# Patient Record
Sex: Male | Born: 1950
Health system: Southern US, Community
[De-identification: ages and names within clinical notes are randomized; demographics above are authoritative.]

## PROBLEM LIST (undated history)

## (undated) DIAGNOSIS — R569 Unspecified convulsions: Secondary | ICD-10-CM

## (undated) DIAGNOSIS — F32A Depression, unspecified: Secondary | ICD-10-CM

## (undated) DIAGNOSIS — E7800 Pure hypercholesterolemia, unspecified: Secondary | ICD-10-CM

## (undated) DIAGNOSIS — I639 Cerebral infarction, unspecified: Secondary | ICD-10-CM

## (undated) DIAGNOSIS — F329 Major depressive disorder, single episode, unspecified: Secondary | ICD-10-CM

## (undated) HISTORY — PX: NECK SURGERY: SHX720

## (undated) HISTORY — PX: OTHER SURGICAL HISTORY: SHX169

## (undated) HISTORY — DX: Depression, unspecified: F32.A

## (undated) HISTORY — DX: Major depressive disorder, single episode, unspecified: F32.9

## (undated) HISTORY — DX: Unspecified convulsions: R56.9

## (undated) HISTORY — DX: Cerebral infarction, unspecified: I63.9

## (undated) HISTORY — DX: Pure hypercholesterolemia, unspecified: E78.00

## (undated) HISTORY — PX: ELBOW SURGERY: SHX618

---

## 2000-01-23 ENCOUNTER — Emergency Department (HOSPITAL_COMMUNITY): Admission: EM | Admit: 2000-01-23 | Discharge: 2000-01-23 | Payer: Self-pay | Admitting: Emergency Medicine

## 2000-01-24 ENCOUNTER — Encounter: Payer: Self-pay | Admitting: Emergency Medicine

## 2002-08-16 ENCOUNTER — Ambulatory Visit: Admission: RE | Admit: 2002-08-16 | Discharge: 2002-08-16 | Payer: Self-pay | Admitting: Internal Medicine

## 2002-08-16 ENCOUNTER — Encounter: Payer: Self-pay | Admitting: Internal Medicine

## 2002-08-16 ENCOUNTER — Encounter: Payer: Self-pay | Admitting: *Deleted

## 2002-08-16 ENCOUNTER — Inpatient Hospital Stay (HOSPITAL_COMMUNITY): Admission: EM | Admit: 2002-08-16 | Discharge: 2002-08-20 | Payer: Self-pay | Admitting: *Deleted

## 2002-08-17 ENCOUNTER — Encounter: Payer: Self-pay | Admitting: Cardiology

## 2002-08-19 ENCOUNTER — Encounter: Payer: Self-pay | Admitting: Neurology

## 2002-08-20 ENCOUNTER — Inpatient Hospital Stay (HOSPITAL_COMMUNITY)
Admission: RE | Admit: 2002-08-20 | Discharge: 2002-10-07 | Payer: Self-pay | Admitting: Physical Medicine & Rehabilitation

## 2002-08-23 ENCOUNTER — Encounter: Payer: Self-pay | Admitting: Neurology

## 2002-08-24 ENCOUNTER — Encounter: Payer: Self-pay | Admitting: Neurology

## 2002-09-28 ENCOUNTER — Encounter: Payer: Self-pay | Admitting: Physical Medicine & Rehabilitation

## 2002-11-06 ENCOUNTER — Emergency Department (HOSPITAL_COMMUNITY): Admission: EM | Admit: 2002-11-06 | Discharge: 2002-11-06 | Payer: Self-pay | Admitting: Emergency Medicine

## 2002-11-06 ENCOUNTER — Encounter: Payer: Self-pay | Admitting: Emergency Medicine

## 2002-11-09 ENCOUNTER — Encounter
Admission: RE | Admit: 2002-11-09 | Discharge: 2003-02-07 | Payer: Self-pay | Admitting: Physical Medicine & Rehabilitation

## 2002-12-08 ENCOUNTER — Encounter
Admission: RE | Admit: 2002-12-08 | Discharge: 2003-03-08 | Payer: Self-pay | Admitting: Physical Medicine & Rehabilitation

## 2003-02-17 ENCOUNTER — Encounter
Admission: RE | Admit: 2003-02-17 | Discharge: 2003-05-18 | Payer: Self-pay | Admitting: Physical Medicine & Rehabilitation

## 2003-06-01 ENCOUNTER — Encounter
Admission: RE | Admit: 2003-06-01 | Discharge: 2003-08-30 | Payer: Self-pay | Admitting: Physical Medicine & Rehabilitation

## 2003-09-23 ENCOUNTER — Encounter
Admission: RE | Admit: 2003-09-23 | Discharge: 2003-12-22 | Payer: Self-pay | Admitting: Physical Medicine & Rehabilitation

## 2003-12-15 ENCOUNTER — Ambulatory Visit (HOSPITAL_COMMUNITY): Admission: RE | Admit: 2003-12-15 | Discharge: 2003-12-15 | Payer: Self-pay | Admitting: Neurology

## 2003-12-23 ENCOUNTER — Encounter
Admission: RE | Admit: 2003-12-23 | Discharge: 2004-01-13 | Payer: Self-pay | Admitting: Physical Medicine & Rehabilitation

## 2004-04-06 ENCOUNTER — Encounter
Admission: RE | Admit: 2004-04-06 | Discharge: 2004-06-06 | Payer: Self-pay | Admitting: Physical Medicine & Rehabilitation

## 2004-04-10 ENCOUNTER — Ambulatory Visit: Payer: Self-pay | Admitting: Physical Medicine & Rehabilitation

## 2004-07-04 ENCOUNTER — Encounter
Admission: RE | Admit: 2004-07-04 | Discharge: 2004-10-02 | Payer: Self-pay | Admitting: Physical Medicine & Rehabilitation

## 2004-07-06 ENCOUNTER — Ambulatory Visit: Payer: Self-pay | Admitting: Physical Medicine & Rehabilitation

## 2004-12-19 ENCOUNTER — Encounter
Admission: RE | Admit: 2004-12-19 | Discharge: 2005-03-19 | Payer: Self-pay | Admitting: Physical Medicine & Rehabilitation

## 2004-12-19 ENCOUNTER — Ambulatory Visit: Payer: Self-pay | Admitting: Physical Medicine & Rehabilitation

## 2005-01-31 ENCOUNTER — Ambulatory Visit: Payer: Self-pay | Admitting: Physical Medicine & Rehabilitation

## 2005-02-28 ENCOUNTER — Ambulatory Visit: Payer: Self-pay | Admitting: Family Medicine

## 2005-04-05 ENCOUNTER — Ambulatory Visit (HOSPITAL_COMMUNITY): Admission: RE | Admit: 2005-04-05 | Discharge: 2005-04-06 | Payer: Self-pay | Admitting: Neurosurgery

## 2005-04-30 ENCOUNTER — Encounter: Admission: RE | Admit: 2005-04-30 | Discharge: 2005-04-30 | Payer: Self-pay | Admitting: Neurosurgery

## 2005-05-02 ENCOUNTER — Encounter
Admission: RE | Admit: 2005-05-02 | Discharge: 2005-07-31 | Payer: Self-pay | Admitting: Physical Medicine & Rehabilitation

## 2005-07-29 ENCOUNTER — Other Ambulatory Visit: Payer: Self-pay

## 2005-07-29 ENCOUNTER — Inpatient Hospital Stay: Payer: Self-pay | Admitting: Internal Medicine

## 2005-08-08 ENCOUNTER — Encounter
Admission: RE | Admit: 2005-08-08 | Discharge: 2005-11-06 | Payer: Self-pay | Admitting: Physical Medicine & Rehabilitation

## 2005-08-08 ENCOUNTER — Ambulatory Visit: Payer: Self-pay | Admitting: Physical Medicine & Rehabilitation

## 2006-01-15 ENCOUNTER — Encounter
Admission: RE | Admit: 2006-01-15 | Discharge: 2006-04-15 | Payer: Self-pay | Admitting: Physical Medicine & Rehabilitation

## 2006-01-15 ENCOUNTER — Ambulatory Visit: Payer: Self-pay | Admitting: Physical Medicine & Rehabilitation

## 2006-06-23 ENCOUNTER — Other Ambulatory Visit: Payer: Self-pay

## 2006-06-23 ENCOUNTER — Emergency Department: Payer: Self-pay | Admitting: Emergency Medicine

## 2009-05-21 ENCOUNTER — Emergency Department: Payer: Self-pay | Admitting: Emergency Medicine

## 2009-06-09 ENCOUNTER — Encounter: Admission: RE | Admit: 2009-06-09 | Discharge: 2009-06-09 | Payer: Self-pay | Admitting: Neurosurgery

## 2009-06-19 ENCOUNTER — Ambulatory Visit (HOSPITAL_COMMUNITY): Admission: RE | Admit: 2009-06-19 | Discharge: 2009-06-20 | Payer: Self-pay | Admitting: Neurosurgery

## 2009-11-05 ENCOUNTER — Emergency Department (HOSPITAL_COMMUNITY): Admission: EM | Admit: 2009-11-05 | Discharge: 2009-11-05 | Payer: Self-pay | Admitting: Emergency Medicine

## 2010-04-25 ENCOUNTER — Ambulatory Visit: Payer: Self-pay

## 2010-05-09 ENCOUNTER — Ambulatory Visit: Payer: Self-pay | Admitting: Pain Medicine

## 2010-09-10 LAB — COMPREHENSIVE METABOLIC PANEL
ALT: 18 U/L (ref 0–53)
AST: 29 U/L (ref 0–37)
Albumin: 3.3 g/dL — ABNORMAL LOW (ref 3.5–5.2)
Alkaline Phosphatase: 102 U/L (ref 39–117)
BUN: 8 mg/dL (ref 6–23)
CO2: 26 mEq/L (ref 19–32)
Calcium: 8.9 mg/dL (ref 8.4–10.5)
Chloride: 109 mEq/L (ref 96–112)
Creatinine, Ser: 0.94 mg/dL (ref 0.4–1.5)
GFR calc Af Amer: >60 mL/min (ref >60)
GFR calc non Af Amer: >60 mL/min (ref >60)
Glucose, Bld: 102 mg/dL — ABNORMAL HIGH (ref 70–99)
Potassium: 3.9 mEq/L (ref 3.5–5.1)
Sodium: 139 mEq/L (ref 135–145)
Total Bilirubin: 0.5 mg/dL (ref 0.3–1.2)
Total Protein: 6.3 g/dL (ref 6.0–8.3)

## 2010-09-10 LAB — RAPID URINE DRUG SCREEN, HOSP PERFORMED
Amphetamines: NOT DETECTED
Barbiturates: NOT DETECTED
Benzodiazepines: NOT DETECTED
Cocaine: NOT DETECTED
Opiates: NOT DETECTED
Tetrahydrocannabinol: NOT DETECTED

## 2010-09-10 LAB — DIFFERENTIAL
Basophils Absolute: 0.1 10*3/uL (ref 0.0–0.1)
Basophils Relative: 1 % (ref 0–1)
Eosinophils Absolute: 0.1 10*3/uL (ref 0.0–0.7)
Eosinophils Relative: 2 % (ref 0–5)
Lymphocytes Relative: 35 % (ref 12–46)
Lymphs Abs: 2 10*3/uL (ref 0.7–4.0)
Monocytes Absolute: 0.4 10*3/uL (ref 0.1–1.0)
Monocytes Relative: 7 % (ref 3–12)
Neutro Abs: 3.1 10*3/uL (ref 1.7–7.7)
Neutrophils Relative %: 55 % (ref 43–77)

## 2010-09-10 LAB — URINALYSIS, ROUTINE W REFLEX MICROSCOPIC
Bilirubin Urine: NEGATIVE
Glucose, UA: NEGATIVE mg/dL
Ketones, ur: NEGATIVE mg/dL
Leukocytes, UA: NEGATIVE
Nitrite: NEGATIVE
Protein, ur: 30 mg/dL — AB
Specific Gravity, Urine: 1.015 (ref 1.005–1.030)
Urobilinogen, UA: 0.2 mg/dL (ref 0.0–1.0)
pH: 5.5 (ref 5.0–8.0)

## 2010-09-10 LAB — CBC
HCT: 41.8 % (ref 39.0–52.0)
Hemoglobin: 14.2 g/dL (ref 13.0–17.0)
MCHC: 33.9 g/dL (ref 30.0–36.0)
MCV: 86.3 fL (ref 78.0–100.0)
Platelets: 207 10*3/uL (ref 150–400)
RBC: 4.84 MIL/uL (ref 4.22–5.81)
RDW: 14.9 % (ref 11.5–15.5)
WBC: 5.7 10*3/uL (ref 4.0–10.5)

## 2010-09-10 LAB — URINE MICROSCOPIC-ADD ON

## 2010-09-10 LAB — PROTIME-INR
INR: 1.83 — ABNORMAL HIGH (ref 0.00–1.49)
Prothrombin Time: 21 seconds — ABNORMAL HIGH (ref 11.6–15.2)

## 2010-09-10 LAB — ETHANOL: Alcohol, Ethyl (B): <5 mg/dL (ref 0–10)

## 2010-09-24 LAB — BASIC METABOLIC PANEL
BUN: 13 mg/dL (ref 6–23)
CO2: 27 mEq/L (ref 19–32)
Calcium: 9.3 mg/dL (ref 8.4–10.5)
Chloride: 107 mEq/L (ref 96–112)
Creatinine, Ser: 1.04 mg/dL (ref 0.4–1.5)
GFR calc Af Amer: >60 mL/min (ref >60)
GFR calc non Af Amer: >60 mL/min (ref >60)
Glucose, Bld: 91 mg/dL (ref 70–99)
Potassium: 5 mEq/L (ref 3.5–5.1)
Sodium: 139 mEq/L (ref 135–145)

## 2010-09-24 LAB — CBC
HCT: 44.1 % (ref 39.0–52.0)
Hemoglobin: 14.9 g/dL (ref 13.0–17.0)
MCHC: 33.9 g/dL (ref 30.0–36.0)
MCV: 86.3 fL (ref 78.0–100.0)
Platelets: 202 10*3/uL (ref 150–400)
RBC: 5.11 MIL/uL (ref 4.22–5.81)
RDW: 14.9 % (ref 11.5–15.5)
WBC: 6.8 10*3/uL (ref 4.0–10.5)

## 2010-09-24 LAB — PROTIME-INR
INR: 1.51 — ABNORMAL HIGH (ref 0.00–1.49)
Prothrombin Time: 18.1 seconds — ABNORMAL HIGH (ref 11.6–15.2)

## 2010-10-19 ENCOUNTER — Emergency Department: Payer: Self-pay | Admitting: Emergency Medicine

## 2010-11-09 NOTE — Procedures (Signed)
NAMEKAIREE, ISA              ACCOUNT NO.:  [id number]   MEDICAL RECORD NO.:  [id number]           PATIENT TYPE:   LOCATION:                                 FACILITY:   PHYSICIAN:  Ranelle Oyster, M.D.DATE OF BIRTH:  08-02-1950   DATE OF PROCEDURE:  08/27/2005  DATE OF DISCHARGE:                                 OPERATIVE REPORT   MEDICAL RECORD NUMBER:  [id number]   DATE OF BIRTH:  [birth year]   PROCEDURE:  Phenol injection.   ATTENDING:  Ranelle Oyster, M.D.   INDICATION:  Spastic hemiparesis, ICD-9 code 342.11.   DESCRIPTION OF PROCEDURE:  After informed consent and preparation with  isopropyl alcohol, we localized the median nerve with needle stimulation,  first approximately 80 mA and this was gradually titrated down to less than  0.8 mA with the same muscle activity.  We utilized a 50-mm, 26-gauge  monopolar needle.  After aspiration and localization of the nerve, we  injected 5 mL of aqueous phenol solution.  The patient tolerated this fairly  well.  He became a bit anxious, but otherwise did well.  He had immediate  relaxation of the right wrist and hand muscles.  No bleeding complications  were experienced.   DISCHARGE INSTRUCTIONS:  1.  Post-injection instructions were given.  2.  The patient will continue on his Dantrium for now.  3.  Recommend continued use of his AFO.  4.  I will have the patient follow up with me in approximately 3-4 months'      time.  We will hold off on any therapy at this point.      Ranelle Oyster, M.D.  Electronically Signed     ZTS/MEDQ  D:  08/27/2005 16:06:28  T:  08/28/2005 10:45:49  Job:  [id number]

## 2010-11-09 NOTE — Procedures (Signed)
EEG No. [id number]   REFERRING PHYSICIAN:  Rene Kocher, M.D.   CLINICAL HISTORY:  The patient is described as awake and alert.  This is a  routine EEG done with photic and hyperventilation.  This is a 60 year old  man who reports episodic, left-sided numbness and tingling.  He has a  history of a left brain stroke in February 2004.  EEG is for evaluation of  possible seizure.   PROCEDURE:  The dominant rhythm tracing is a moderate amplitude alpha rhythm  of 10-11 Hz which dominates posteriorly, appears more prominently on the  right than on the left and attenuates with eye opening and closing.  Low  amplitude fast activity is seen frontally more on the right than on the  left.  The left-sided rhythm consists mostly of low to moderate amplitude  theta rhythms in the 5-7 Hz range particularly in the temporal area.  No  epileptiform discharges are seen.  The patient remained awake and alert  throughout the recording.  Photic stimulation produced weak, driving  responses.  Hyperventilation produced some buildup of the background rhythms  with a little bit more prominence of the left-sided slowing, but no  appearance of irritability.  Single channel devoted EKG reveals sinus rhythm  throughout with a rate of approximately 60 beats per minute.   CONCLUSION:  Abnormal study due to focal left-sided slowing.  Findings  suggestive of underlying focal dysfunction and consistent with the given  history of a previous left brain stroke.  No epileptiform discharges are  seen.  No findings which would suggest an etiology of left body paresthesias  are elicited.    Fenton Malling, M.D.   EAV:WUJW  D:  12/16/2003 09:51:37  T:  12/16/2003 13:07:03  Job #:  [id number]

## 2010-11-09 NOTE — Procedures (Signed)
   NAMEUCHECHUKWU, DHAWAN                        ACCOUNT NO.:  [id number]   MEDICAL RECORD NO.:  [id number]                   PATIENT TYPE:  INP   LOCATION:  IC07                                 FACILITY:  [facility]   PHYSICIAN:  Vida Roller, M.D.                DATE OF BIRTH:  08-Feb-1951   DATE OF PROCEDURE:  08/16/2002  DATE OF DISCHARGE:                                  ECHOCARDIOGRAM   REFERRING PHYSICIAN:  Gracelyn Nurse, M.D.   PROCEDURE:  Echocardiogram.  Tape #[id number], tape count 1285 to 1721.   REASON FOR PROCEDURE:  This is a patient with a CVA looking for an etiology  for the same.  Quality of this study is adequate.   M-MODE MEASUREMENTS:  1. The aorta is 34 mm.  2. The left atrium is 38 mm.  3. The septum is 11 mm.  4. The posterior wall is 10 mm.  5. The left ventricular diastolic dimension is 41 mm.  6. The left ventricular systolic dimension is 33 mm.   2-D AND DOPPLER IMAGING:  1. The left ventricle is normal size with normal systolic and diastolic     function.  No wall motion abnormalities are seen.  2. The right ventricle is normal size.  3. Both atria are normal size.  There is no evidence of an atrioseptal     defect.  4. The aortic valve is not well seen but appears to function normally with     no evidence of stenosis or regurgitation.  5. The mitral valve shows trace mitral regurgitation.  No stenosis is seen.  6. The tricuspid valve is morphologically unremarkable with trace tricuspid     regurgitation.  No stenosis is seen.  7. The pulmonic valve was not well seen.  8. The ascending aorta is not well seen.  9. The pericardial structures are without effusion and appear normal.   FINAL POINT:  No obvious etiology for cerebrovascular accident is seen.                                               Vida Roller, M.D.    JH/MEDQ  D:  08/16/2002  T:  08/16/2002  Job:  [id number]

## 2010-11-09 NOTE — Discharge Summary (Signed)
NAMECLEVER, GERALDO                        ACCOUNT NO.:  [id number]   MEDICAL RECORD NO.:  [id number]                   PATIENT TYPE:  INP   LOCATION:  3025                                 FACILITY:  [facility]   PHYSICIAN:  Marlan Palau, M.D.               DATE OF BIRTH:  [birth year]   DATE OF ADMISSION:  08/16/2002  DATE OF DISCHARGE:  08/20/2002                                 DISCHARGE SUMMARY   ADMISSION DIAGNOSES:  1. New onset aphasia, right hemiparesis, rule out left brain stroke.  2. Chronic obstructive pulmonary disease.   DISCHARGE DIAGNOSES:  1. Left internal carotid artery occlusion with large, left, middle cerebral     artery distribution stroke.  2. Chronic obstructive pulmonary disease.   PROCEDURES:  1. Cerebral angiogram.  2. 2-D echocardiogram.  3. MRI of the brain.  4. Carotid Doppler study.   COMPLICATIONS FOR ABOVE PROCEDURES:  None.   HISTORY OF PRESENT ILLNESS:  [name] [name] is a 60 year old, right-handed,  white male, born [birth year], with a history of COPD.  This patient was  brought to [facility] after the onset of problems with weakness,  numbness on the right side of the body with aphasia as well.  The patient  was suspected of having a large, left brain stroke.  The patient was  admitted for further evaluation.  The patient underwent a CT scan of the  brain that showed evidence of an evolving left brain stroke in the middle  cerebral distribution which was correlated with suspected left internal  carotid artery occlusion.  The patient was admitted for further evaluation.   PAST MEDICAL HISTORY:  1. History of COPD.  2. New onset of right hemiparesis, aphasia as above.  3. Peptic ulcer disease.  4. Appendectomy.  5. Left elbow surgery in 2001.   The patient has no known drug allergies.  Was on no medications prior to  admission.  Was drinking a pint of alcohol on the weekends.  Smokes 1-1/2  packs of cigarettes a day, has done  so for 30 years.  Denies history of  cocaine use.  Please refer to history and physical dictation summary for  social history, family history, review of systems, physical examination.   LABORATORY VALUES:  Notable for anticardiolipin antibody panel that was  negative.  Sodium 139, potassium 3.8, chloride of 108, CO2 of 25, glucose of  85, BUN of 13, creatinine 1.0, calcium of 8.9.  White count 11.3, hemoglobin  of 14.0, hematocrit of 40.8, MCV of 82.9, platelets of 259.  Antithrombin-3  level normal.  Factor V Leiden is pending at this time.  Protein S, protein  C levels pending.  CCA level is 138.  ANA is negative.  Sed rate was 25.  RPR nonreactive.  EKG reveals normal sinus rhythm, inferior infarct, age  undetermined, heart rate of 92.  Two-D echocardiogram shows an ejection  fraction of  55 to 65%, mild mitral annular calcification was noted.  The  patient underwent an MRI scan of the brain showing evidence of a large,  left, middle cerebral artery distribution stroke.  MR angiogram confirmed an  occlusion of the left internal carotid artery. This was once again confirmed  by cerebral angiography.   The patient has remained aphasic, is mute.  Seems to understand language  some.  Has a dense right homonymous hemianopsia and dense right hemiparesis.  The patient has been seen by physical, occupational, and speech therapy.  Rehab has seen him.  The patient was felt to have a weak pulse in the right  leg following arteriogram.  Arterial Doppler study shows mild reduction in  flow on the right, normal on the left.  Venous Doppler studies showed no  evidence of DVT on the examinations.  The patient has made some mild  improvements over time with understanding and speech.  Was seen by Rehab and  plans are made for transfer to Rehab at this time.  At the time of transfer,  this patient is on Lovenox 40 mg subcu, daily aspirin 325 mg a day, Pepcid  20 mg b.i.d., Darvocet if needed.  The patient  is on a dysphagia-3, chopped  meat, nectar-thick, liquid diet.  This patient will be followed while on  Rehab.                                               Marlan Palau, M.D.    CKW/MEDQ  D:  08/20/2002  T:  08/20/2002  Job:  [id number]   cc:   [facility]  [address]   Rehabilitation   Gracelyn Nurse, M.D.  [address]  [place] [address]  Fax: [phone]   [facility] Mina  [address] [place]  [place]  [place] [address]  Fax: [phone]

## 2010-11-09 NOTE — Discharge Summary (Signed)
[name] [name], FRIEDLAND                        ACCOUNT NO.:  [id number]   MEDICAL RECORD NO.:  [id number]                   PATIENT TYPE:  OUT   LOCATION:  DFTL                                 FACILITY:  [facility]   PHYSICIAN:  Gracelyn Nurse, M.D.              DATE OF BIRTH:  1951/04/08   DATE OF ADMISSION:  08/16/2002  DATE OF DISCHARGE:  08/16/2002                                 DISCHARGE SUMMARY   DISCHARGE DIAGNOSIS:  Cerebrovascular accident.  a.  Completely occluded left internal carotid artery.  b.  Completely occluded left middle cerebral artery.  c.  Right hemiparesis.  d.  Dysarthria.   DISCHARGE MEDICATIONS:  Aspirin 325 mg daily.   PROCEDURES:  1. Carotid Dopplers which showed a large, soft plaque at the left carotid     bifurcation extending to the origin of the left internal carotid artery.     There is abnormal flow within the left internal carotid artery.  2. MRI/MRA of the brain.  This showed complete occlusion of the left     internal carotid artery and left middle cerebral artery.  It also showed     a left basal ganglia stroke and left frontal lobe stroke.   REASON FOR ADMISSION:  This is a 60 year old white male with no significant  medical history.  His wife says he awoke early this morning trying to get  out of bed but was unable to get up.  He could not use his right side and  was unable to speak.  She said he complained yesterday about a headache and  was given something by a friend of his for his headache.  She also tells [place]  that he had been drinking some homemade liquor one day prior to having the  headache.   HOSPITAL COURSE:  Cerebrovascular accident.  The patient had symptoms as  noted above.  He was admitted and started on aspirin.  He could not swallow  at that time, so Plavix was not added.  He was not placed on anticoagulation  because this was thought to be a large vessel stroke.  MRI/MRA, carotid  Dopplers, and 2-D echocardiogram were  ordered with above results.  The 2-D  echocardiogram was pending at time of discharge.  Once the MRI/MRA results  were reported, the patient was then transferred to [facility] to  the neurology service for further workup and evaluation including possible  angiogram.   DISPOSITION:  The patient is being transferred to [facility] to the  neurology service.  He is currently in guarded condition.                                               Gracelyn Nurse, M.D.    JDJ/MEDQ  D:  09/01/2002  T:  09/01/2002  Job:  [id number]

## 2010-11-09 NOTE — Assessment & Plan Note (Signed)
INTERVAL HISTORY:  [name] [name] is back regarding his left CVA with right spastic  hemiparesis.  Spasticity has been about the same.  No significant speech  changes have been noted.  He does do better with naming now currently.  He  was hospitalized last week for DVT and pulmonary embolism.  He has recovered  nicely and he is on Coumadin currently.   REVIEW OF SYSTEMS:  Without significant change today.  Full review is in the  health and history section.   SOCIAL HISTORY:  Wife is working.  Patient is home alone and helps around  the house.  He is not doing much for leisure activities.   PHYSICAL EXAMINATION:  Blood pressure is 101/72, pulse 77, respiratory rate  16, he is saturating 98% in room air.  The patient is generally pleasant in  no acute distress.  He does utter words on command a little bit better but  still has significant problems with wordfinding.  Tone is worsened at the  right wrist at 2+/4 as well as the fingers.  Right shoulder tone is trace.  Right lower extremity tone is 1/4.  He continues to have weakness of ankle  dorsiflexion and generally motor function is 4/5 in the right leg except for  the ankle which is 2+/5.  The right arm is 1+/5.  Sensation is decreased is  decreased throughout the right side.   ASSESSMENT:  1.  Left cerebrovascular accident with spastic right hemiparesis.  2.  Expressive aphasia.  3.  Adhesive capsulitis of the right shoulder.  4.  Pulmonary embolism.   PLAN:  1.  We will set the patient up for a phenol injection of the right median      nerve at the elbow.  I think he will do well with this.  He should be      fine to do this while on the Coumadin.  If we experience any bleeding      complications, we will stop the procedure.  2.  Encouraged ambulation with his brace once again.  We will keep him on      the Dantrium for now.  I wonder if he could benefit from a hinged AFO.  3.  I will see the patient back pending scheduling of his  study.      Ranelle Oyster, M.D.  Electronically Signed     ZTS/MedQ  D:  08/09/2005 16:29:11  T:  08/09/2005 22:50:25  Job #:  [id number]

## 2010-11-09 NOTE — H&P (Signed)
[name], [name]                        ACCOUNT NO.:  [id number]   MEDICAL RECORD NO.:  [id number]                   PATIENT TYPE:  EMS   LOCATION:  ED                                   FACILITY:  [facility]   PHYSICIAN:  Gracelyn Nurse, M.D.              DATE OF BIRTH:  02-Oct-1950   DATE OF ADMISSION:  08/16/2002  DATE OF DISCHARGE:                                HISTORY & PHYSICAL   CHIEF COMPLAINT:  Altered mental status.   HISTORY OF PRESENT ILLNESS:  This is a 59 year old white male with no  significant medical problems.  His wife says that he awoke her early this  morning trying to get out of bed and was able to get out of bed.  He was not  using his right side and he could not speak.  She said he has never had  symptoms like this before, that he was unable to tell her what he wanted,  did not seem to recognize her.  She said the right side of his face seemed  to be drawing up.  Upon presentation in the ER, he is able to move his right  side but not to commands, just spontaneously, but he is unable to speak.   PAST MEDICAL HISTORY:  Status post left shoulder and left elbow surgery.   ALLERGIES:  No known drug allergies.   CURRENT MEDICATIONS:  None.   SOCIAL HISTORY:  He does smoke cigarettes.  He drinks alcohol occasionally.  He is married.   FAMILY HISTORY:  Mother died at age 36 of complications of an MVA.  Father  is 71 and has had a stroke.  Brother died of lymphoma.   REVIEW OF SYSTEMS:  Unable to obtain from the patient.   PHYSICAL EXAMINATION:  VITAL SIGNS:  Temperature is 98, pulse 58,  respirations 18, blood pressure 122/86.  GENERAL:  This is a well-nourished white male who is awake but totally  disoriented.  He is moving his limbs spontaneously.  HEENT:  Pupils are equal, round, and reactive to light.  Equal ocular  movement is intact.  Oral mucosa is moist.  Oropharynx is clear.  CARDIOVASCULAR:  Regular rate and rhythm.  No murmurs.  LUNGS:  Clear  to auscultation.  ABDOMEN:  Soft, nontender, nondistended.  Bowel sounds are positive.  EXTREMITIES:  No edema.  NEUROLOGIC:  He does move all four extremities spontaneously.  He will  squeeze with his left hand on command and push down on his left foot on  command but will not do so on the right side.  He did not appear to  recognize his environment and he cannot speak.  SKIN:  Moist with no rash.   ADMITTING LABORATORY DATA:  White blood cells 9.7, hemoglobin 14.4,  platelets 275.  Sodium 136, potassium 4.2, chloride 109, CO2 24, BUN 16,  creatinine 1.1, glucose 122.  CK is 205, MB  fraction is 2.5, troponin-I is  0.01.   CT scan shows no acute findings.   ASSESSMENT AND PLAN:  1. Stroke, likely a left middle cerebral artery distribution.  We will give     him intravenous fluid bolus, keep him well hydrated, keep him lying flat,     give him aspirin rectally.  Admit to the intensive care unit in case he     does lose control of his airway.  We will get an MRI/MRA, carotid     Dopplers, and 2-D echocardiogram.  Since this is likely a large vessel     stroke, we will hold off on anticoagulation at this point, consult     neurology.  2. Altered mental status.  This is likely secondary to #1; however, for     completeness we will check a urine drug screen.                                               Gracelyn Nurse, M.D.    JDJ/MEDQ  D:  08/16/2002  T:  08/16/2002  Job:  [id number]

## 2010-11-09 NOTE — Assessment & Plan Note (Signed)
MEDICAL RECORD NUMBER:  [id number].   [name] [name] is back regarding his stroke and left sided spasticity and aphasia.  He has had some improvement in the right shoulder after the pectoralis major  Botox injection; however, the shoulder is still tight at times. Biceps are  also tight. The patient has been finding some activity to do at home but  generally appears bored. He has had no further episodes of any neurological  instability and has had no more anxiety symptoms. Mood seems generally  improved. Denies any falls. He has not been involved in speech therapy since  the summer.   SOCIAL HISTORY:  Wife works. The patient is home alone during the day.   REVIEW OF SYSTEMS:  All pertinent positives listed above. Full review of  systems section completed in the health and history section of our chart.   PHYSICAL EXAMINATION:  Blood pressure is 106/72, pulse 70, respiratory rate  18. He is saturating 97% on room air. The patient walks with a limp favoring  the right side but does not loss balance. Affect is fair. Appearance is well  kept. He has 1+ to 2+ strength in the right upper extremity. Tone ranges in  the 1+ to 2/4 range. Shoulder was moveable to 90 degrees of abduction today.  He is able to fully extend the elbow with passive movement. He did have  shoulder abnormality at the acromion which appeared to be posteriorly and  superiorly positioned due to a chronic injury and surgery. Speech is grossly  aphasic. The patient answers yes and I am going to do it most of time  for all his responses. He seems to communicate with facial expresses and  head nods. Right lower extremity is 3+/5. Reflexes are hyperactive  throughout. No gross chronic tone is noted in the right leg.   ASSESSMENT:  1.  Left cerebral vascular accident with right spastic hemiparesis.  2.  Expressive aphasia.  3.  Adhesive capsulitis of the right shoulder exacerbated by spasticity of      the pectoralis major. He also has  an old right shoulder injury.   PLAN:  1.  Will taper Baclofen to off over the next two weeks' time.  2.  Discontinue Tegretol.  3.  Continue Celexa for mood.  4.  I can see the patient back in 6 months' time. I did fill out a      prescription to help add the patient to get back into speech therapy at      [facility] if this is possible. I think he can benefit still from this.      Zach   ZTS/MedQ  D:  07/06/2004 16:58:13  T:  07/06/2004 20:53:20  Job #:  [id number]

## 2010-11-09 NOTE — Op Note (Signed)
[name] [name], [name] [name]              ACCOUNT NO.:  [id number]   MEDICAL RECORD NO.:  [id number]          PATIENT TYPE:  INP   LOCATION:  3002                         FACILITY:  [facility]   PHYSICIAN:  Reinaldo Meeker, M.D. DATE OF BIRTH:  12-03-1950   DATE OF PROCEDURE:  04/05/2005  DATE OF DISCHARGE:                                 OPERATIVE REPORT   PREOPERATIVE DIAGNOSIS:  Herniated disc C4-C5 and C5-C6 with severe spinal  cord compression and spinal cord contusion.   POSTOPERATIVE DIAGNOSIS:  Herniated disc C4-C5 and C5-C6 with severe spinal  cord compression and spinal cord contusion.   PROCEDURE:  C4-C5 and C5-C6 anterior cervical discectomy with bone bank  fusion followed by Accufix anterior cervical plating.   SURGEON:  Reinaldo Meeker, M.D.   ASSISTANT:  Tia Alert, M.D.   PROCEDURE IN DETAIL:  After being placed in the supine position and 5 pounds  of halter traction, the patient's neck was prepped and draped in the usual  sterile fashion.  Localizing x-ray was taken prior to the incision to  identify the appropriate level.  A transverse incision was made on the left  side of the neck starting at the midline and heading towards the medial  aspect of the sternocleidomastoid muscle.  The platysmal muscle was incised  transversely.  The natural fascial plane between the strap muscles medially,  sternocleidomastoid muscle laterally, and follows down to the anterior  aspect of the anterior aspect of the cervical spine.  The longus colli  muscles were identified, split in the midline, swept away bilaterally, and  held with a Art therapist.  Self-retaining retractor was placed for exposure  and an x-ray showed we approached the appropriate level.  Using the 15  blade, the annulus of the disc at C4-C5 and C5-C6 was incised.  Using  pituitary rongeurs and curets, the disc material was removed.  A high speed  drill was used to widen the interspace and, in fact, it was markedly  collapsed.  At this time, the microscope was draped and brought into the  field and used for the remainder of the case.  Starting at C4-C5, the  remainder of the disc material on the posterior longitudinal ligament was  removed.  The ligament was then incised transversely and the cut edge was  removed.  Spinal cord decompression was then carried out as well as proximal  foraminal decompression bilaterally.  Attention was turned to C5-C6 where,  once again, the remainder of the disc was removed.  Once again, this was  carried down to the posterior longitudinal ligament which was then removed.  Once again, thorough spinal cord decompression was carried out as well as  proximal foraminal decompression bilaterally.  At this point, inspection was  carried out at both levels for any evidence of residual compression and none  could be identified.  An 8 and 9 mm bone bank plug was reconstituted.  After  irrigating once more and confirming hemostasis, the bone plugs were impacted  with the 8 mm plug at C4-C5 and the 9 mm plug at C5-C6.  Fluoroscopy  showed  them to be in good position.  An appropriate length Accufix anterior  cervical plate was then chosen.  Under fluoroscopic guidance, drill holes  were placed, followed by placement of 13 mm screws x 6.  The locking  mechanism was secured at all levels.  Final fluoroscopy showed the plate,  plug, and screws to all be in good position.  Large amounts of irrigation  was carried out.  Any bleeding was controlled by bipolar coagulation.  The  wound was then closed using interrupted  Vicryl in the platysma muscle, inverted 5-0 PDS on the subcuticular layer,  and Steri-Strips on the skin.  Sterile dressings and soft collar were  applied and the patient was extubated and taken to the recovery room in  stable condition.           ______________________________  Reinaldo Meeker, M.D.     ROK/MEDQ  D:  04/05/2005  T:  04/05/2005  Job:  [id number]

## 2010-11-09 NOTE — Assessment & Plan Note (Signed)
BACKGROUND:  Kollin has CVA with right spastic hemiparesis.  He was sent  home with a home exercise program.  We maintained Baclofen at 20 mg t.i.d.  He notes some tightness and particularly curling of the foot and toes.  He  has not heard from [facility] speech therapy program.  I am not sure if the  referral ever was actually made.  Mood has been generally fair.  The patient  has been active and outside of the house with his AFO and without assisted  device.  He had a couple of falls outside but overall has done pretty well.   REVIEW OF SYSTEMS:  The patient denies any difficulty with sleep.  No  nausea, vomiting or diarrhea.  Work finding deficits remain an issue.  He is  still a bit impulsive.  He has had some periodic headaches.   PHYSICAL EXAMINATION:  GENERAL:  The patient is pleasant, in no acute  distress.  VITAL SIGNS:  Blood pressure is 107/76, pulse is 72, he is saturating 97% on  room air.  NEUROLOGIC:  The patient does well with automatic phrases and words and  communicated to a small degree with ease.  Where appropriate he had no  responses.  EXTREMITIES:  Right upper extremity strength was trace with 1+ tone at the  wrist.  Has a poor tone at the right shoulder, biceps was trace.  Right  lower extremity tone was 2 out of 4 at the ankle, trace at the knee.  Motor  strength was trace to absent at the ankle and 2+ to 3+ at the knee and hips.  Reflexes remained hyperactive and he had sustained clonus with stretching of  the ankle.  The patient walks with a circumduction gait pattern on the  right.  He remained generally steady and had no problems with balance for  the most part.   ASSESSMENT:  1. Left cerebrovascular accident with spastic right hemiparesis.  2. Expressive aphasia.  3. Adhesive capsulitis of the shoulder.   PLAN:  1. Continue his Baclofen at a 20 mg t.i.d. dose.  Hold off on checking liver     tests until the beginning of next year.  2. We will set the  patient up for Botox injections of the right gastrosoleus     complex with 200 units of Botox total to be used.  He will go to the     [facility] outpatient spasticity program before and after the injection.  3. I will make another referral to the [facility] speech therapy for potential     continued speech therapies.  4. I have encouraged ongoing stretching and range of motion for the right     side at home.  5. I will see the patient back in approximately one month's time.      Ranelle Oyster, M.D.   ZTS/MedQ  D:  06/03/2003 16:19:55  T:  06/03/2003 20:23:33  Job #:  [id number]

## 2010-11-09 NOTE — Discharge Summary (Signed)
NAMEWEBSTER, PATRONE                        ACCOUNT NO.:  [id number]   MEDICAL RECORD NO.:  [id number]                   PATIENT TYPE:  INP   LOCATION:  3025                                 FACILITY:  [facility]   PHYSICIAN:  Genene Churn. Love, M.D.                 DATE OF BIRTH:  03-13-1951   DATE OF ADMISSION:  08/16/2002  DATE OF DISCHARGE:  08/20/2002                                 DISCHARGE SUMMARY   REASON FOR ADMISSION:  This is the first [facility] hospital admission for  this 60 year old right handed white married male admitted in transfer from  [facility] for evaluation of aphasia and right hemiparesis.   HISTORY OF PRESENT ILLNESS:  This patient has a known history of headaches  and has complained of headaches over the last two weeks prior to admission  without associated head or neck trauma. He complained of headache the day  prior to admission and awoke about 4:30 the morning of admission with  difficulty getting out of bed, was noting to be using his left hand and arm  and not his right hand and arm, and he had no speech. There was no history  of head or neck trauma, complaints of chest pain, or witnessed seizure. He  was seen at the [facility] and had a CT scan of the brain  approximately 6 a.m. showing no definitely abnormalities, but later in the  afternoon, a MRI study of the brain was obtained for evaluation of the  aphasia and right hemiparesis, showing evidence of an acute stroke in the  left insular cortex; also in the right frontal and parietal region with an  occluded left internal carotid artery. The ultrasound also suggested  occlusion of the left internal carotid artery, and the patient was  transferred on the evening of 08/16/02 for further evaluation. He had not  been on aspirin. He had not been on any antiplatelet therapy. He had no  history of hypertension or diabetes mellitus. He did smoke cigarettes. He  has had right shoulder surgery in  1985 and left elbow surgery in 2001,  appendectomy in 1963. He is educated to the 11th grade. He has worked as a  Curator in the past. He has metal involving his left eye, but  this has been removed. Other details of his history have been dictated  elsewhere.   At the time of admission,  revealed a well-developed white male lying in bed  with spontaneous movement of his left arm and leg greater than his right arm  and leg. He would look at the examiner but did not vocalize, did not follow  commands. He would his move his sheets with his left hand and arm. His blood  pressure is 150/80 in the right arm and 140/80 in the left arm. Heart rate  was 60 and regular. There were no carotid or supraclavicular  bruits heard.  He was alert. He looked at the examiner. He did not follow commands. His  cranial nerves examination revealed a right homonymous hemianopia. He had  decreased gait on the right. He had decreased pinprick in the right face.  His motor examination revealed that he could move his right arm in the  approximately 3/5 range. He has no distal grip or strength in the right hand  or arm. He had 2/5 strength in the right leg. He had 2/5 strength in the  right arm. He had decreased sensation in the right face and arm. His plantar  responses were upgoing on the right. His general examination was  unremarkable.   LABORATORY DATA:  Revealed a sodium of 136, potassium 4.2, chloride 109, CO2  24, BUN 16, creatinine 1.1, glucose 122, calcium 8.9. CK 205, MB 2.5,  troponin 0.1. Drug screen positive for benzodiazepines. Urinalysis:  Unremarkable. White blood cell count was 9,700, hemoglobin was 14.4,  hematocrit 42.9, platelet count 275,000. His pro-time was 12.3, INR 0.9, PTT  was 0.7. EKG showed normal sinus rhythm. CT scan which I reviewed [facility] showed no definite abnormality. MRI of the brain reviewed showed  evidence of distribution of ischemic stroke in the left  insulin cortex, the  left frontal, and the left parietal region. MRA showed evidence of an  occluded left internal carotid artery. His other laboratory studies in the  hospital revealed white blood cell count of 15,200 on 08/17/02 and 11,300 on  08/19/02. His hemoglobin remained stable. His estimated sed rate was 25. INR  was 12.2 with pro-time of 12.2. PTT was 27. Antithrombin III was 108.  Protein C 99. Protein C functional 133. Protein S total 134. Protein S  functional 89; all of which were normal studies. His sodiums were 134 to  139, potassium 3.8 range, chloride 108, CO2 25, glucose 185, BUN 13,  creatinine 1.0 range. He had two elevated CKs of 258 to 270 range, but MBs  were always in the normal range and troponins were negative for MI. His  urinalysis revealed small blood, and repeat revealed large blood. He had 7  to 10 red blood cells and 0 to 2 white blood cells on repeat urinalysis. His  serum VDRL was nonreactive. Anticardiolipin antibodies 2. IgG, IgM, and IgA  were negative. Factor V Leiden and [id number] mutation is pending. His Doppler  studies of the right leg revealed no evidence of arterial or venous disease  or Baker's cyst or focal disease. He did have evidence on the arterial study  of diffuse disease in the right leg arterial system. His 2-D echocardiogram  showed left ventricular size normal, overall left ventricular function was  normal, left ventricular ejection fraction was estimated at a range of 55 to  65%. There was no left ventricular regional wall abnormalities. Left  ventricular thickness was normal. There was mild mitral annular  calcification. There were no left ventricular regional abnormalities.  Ejection fraction was in the 55 to 65% range.   An arteriogram was performed by Dr. Corliss Skains on 08/17/02 confirming evidence  of the left internal carotid artery occlusion. The patient tolerated the procedure well. The patient was seen in consultation by  occupational  therapy, speech therapy, and physical therapy. He was also seen by the rehab  service. He was initially on Dysphagia II with nectar-thick liquids and  subsequently advanced with Dysphagia III diet. He complained of some  headache in the hospital. He was transferred to  the rehab service on  08/20/02.   IMPRESSION:  1. Aphasia, code 784.3.  2. Right hemiparesis, code 242.100.  3. Left internal carotid artery occlusion with stroke, code 433.10.  4. Chronic obstructive pulmonary disease, code 496.  5. Headaches.   PLAN:  The plan is at this time is to transfer him to the rehab service and  following him at that time. He is to be discharged on aspirin 325 mg per day  and Lovenox 40 mg subcu daily. He is also on Protonix 40 mg daily.                                               Genene Churn. Sandria Manly, M.D.    JML/MEDQ  D:  08/23/2002  T:  08/23/2002  Job:  [id number]   cc:   Erick Colace, M.D.  [address] [place], [place] [address]  Fax: [phone]

## 2010-11-09 NOTE — H&P (Signed)
[name] [name], [name] [name]                        ACCOUNT NO.:  [id number]   MEDICAL RECORD NO.:  [id number]                   PATIENT TYPE:  INP   LOCATION:  1825                                 FACILITY:  [facility]   PHYSICIAN:  Genene Churn. Love, M.D.                 DATE OF BIRTH:  [birth year]   DATE OF ADMISSION:  08/16/2002  DATE OF DISCHARGE:                                HISTORY & PHYSICAL   PATIENT INFORMATION:  Patient address is [address] [place], [place] , [address].   REASON FOR ADMISSION:  This 60 year old right-handed white married male is  admitted in transfer from [facility] for evaluation of aphagia and  right hemiparesis.   HISTORY OF PRESENT ILLNESS:  The patient has a known prior history of  headaches and has complained of headaches over the last two weeks.  He  complained of a headache yesterday.  He awoke about 4:30 this morning with  difficulty getting out of bed and was noted to be using his left hand and  arm and not his right leg or arm.  There was no speech, head or neck trauma,  complaints of chest pain, or witnessed seizure.  He was taken to the [facility] and had a CT scan of the brain approximately 6 a.m., which I  have reviewed showing no definite abnormalities.  Later in the afternoon, a  MRI study of the brain was obtained for evaluation of aphagia and right  hemiparesis with evidence of an acute stroke in the left insular cortex.  The left frontal and parietal region.  She had distribution and occluded  left internal carotid artery.  Ultrasound studies also suggest the  possibility of an occluded left internal carotid artery.   He had no witnessed seizures during the day.  He was treated with aspirin.  Because of the possibility of further evaluation with arteriography, he was  transferred to [facility].  He has had no history of  headache or neck trauma, drug or alcohol abuse.  His drug screen was  unremarkable.  His 12-lead EKG, CBC, comprehensive metabolic panel, pro  time, and PTT were also unremarkable.   PAST MEDICAL HISTORY:  Significant for no high blood pressure, heart  disease, strokes, cancer, convulsions, veneral disease, chronic obstructive  pulmonary disease, peptic ulcer disease.  He has had wide shoulder surgery  in 1985, left elbow surgery in 2001, and appendectomy in 1963.  He was  educated through the 11th grade.  In skill, he has worked as a Product manager in the past.  He has metal involving his left eye but this has been  ground out according to his wife.   ALLERGIES:  He has no known history of allergies.   MEDICATIONS:  He takes no medications.   SOCIAL HISTORY:  He drinks one pint of alcohol on the  weekend.  He smokes  one and a half packs per day of cigarettes which he has done for 30 years.  There is no history of cocaine use.   FAMILY HISTORY:  His mother died at 37 from motor vehicle accident.  His  father is 40, living and well.  He has one brother who died at 58 of cancer  and another brother 10 living and well.  He has two children-a daughter 69  and a son 25 living and well.   REVIEW OF SYMPTOMS:  There is no history of head or neck trauma.   PHYSICAL EXAMINATION:  GENERAL:  Well-developed white male lying in bed.  He  had spontaneous movement of his left arm and leg, greater than in his right  arm but better than his right leg.  He would look at the examiner but he did  not vocalize and he did not follow commands.  He would move his sheets with  his left hand and arm.  VITAL SIGNS:  His blood pressure in the right and left arm were 150/80 and  140/80.  Heart rate is 60 and regular.  NECK:  There were no carotid or supraclavicular bruits heard.  The neck  flexion and extension maneuvers were unremarkable.  NEUROLOGICAL:  He was alert and looked at the examiner.  Did not follow  commands.  His cranial nerve examination revealed a right  homonymous  hemianopsia.  Decreased gag on the right.  Decreased pinprick in the right  face.  His motor examination revealed that could he move his right arm  proximally at 3 out of 5 range but there was no grip or distal strength in  the right hand.  He had 2/5 strength in the right leg.  He had 2/5 strength  in the right leg.  He had sensation to pinprick in the right face, arm, and  leg.  Primary response was upgoing on the right and downgoing on the left.  He had 2+ reflexes on the right and 1+ on the left.  HEENT:  Tympanic membrane clear.  Thyroid not enlarged.  LUNGS:  Clear to auscultation.  HEART:  Revealed no murmurs.  ABDOMEN:  Bowel sounds were normal.  There was no enlargement of the liver,  spleen, or kidneys.  GENITOURINARY: He was circumcised.  EXTREMITIES:  There was no clubbing, cyanosis, or edema in the extremities.   LABORATORY DATA:  Laboratory data revealed a sodium of 136, potassium of  4.2, chloride 109, CO2 content 24, BUN 16, creatinine 1.1, glucose 122,  calcium 8.9, CK 205, MB 2.5, troponin 0.1.  Drug screen positive for  benzodiazepines.  Urinalysis unremarkable. White blood cell count 9700,  hemoglobin 14.4, hematocrit 42.9, and platelets 275,000.  Pro time 12.,3 INR  0.9, PTT 27.   EKG normal sinus rhythm.  CT scan of the brain which I did without contrast  showed no definite abnormality.  MRI of the brain reviewed with a history of  distribution ischemic stroke, acute.  MRA showed occluded left internal  carotid artery.   IMPRESSION:  1. Aphasia, code 784.3.  2. Right hemiparesis, code 343.1.  3.     Left brain stroke with left internal carotid artery occlusion, code 433.10.   4. Chronic obstructive pulmonary disease, code 496.0.   PLAN:  The plan at this time is to admit the patient for further evaluation  including arteriography.  Genene Churn. Sandria Manly, M.D.   JML/MEDQ  D:  08/16/2002  T:   08/16/2002  Job:  [id number]   cc:   Jerl Mina  [address] [place].  [place]  [place] [address]  Fax: [phone]   Gracelyn Nurse, M.D.  [address] [place]  [place] [address]  Fax: [phone]

## 2010-11-09 NOTE — Assessment & Plan Note (Signed)
REASON FOR VISIT:  [name] [name] is back regarding his left-sided CVA and spastic  right hemiparesis.  He has good results with the phenol injection to the  right upper extremity, as the right hand remains fairly loose.  The biggest  problem is that the patient stopped taking his antidepressant Celexa and  Coumadin about a month ago.  Wife notes decreased appetite and weight.  He  is resistant to pleas from his family.  He is still taking his Dantrium,  however.   REVIEW OF SYSTEMS:  Pertinent positives are listed above.  The patient  continues to walk with a limp.  He has become increasingly defiant at home  as well.   SOCIAL HISTORY:  The patient is living his wife who remains supportive.   PHYSICAL EXAMINATION:  VITAL SIGNS:  Blood pressure 90/56, pulse 87,  respiratory rate 16, saturating 99% on room air.  NEUROLOGIC:  The patient is irritable.  He does make eye contact and he  follows simple commands.  Gait is wide-based, and the right leg remains a  bit rigid and en-mass.  He has 1/4 tone in the right leg.  Right hand tone  is trace to 1/4.  Elbow tone was trace.  Motor function was 1+ to 2/5 in the  right upper extremity.  Right lower extremity strength is 2 to 2+/5.  The  patient remains expressively aphasic.  He has a mild right central seventh.  HEART:  Regular rate and rhythm.  LUNGS:  Clear.  ABDOMEN:  Soft, nontender.  EXTREMITIES:  He had an area on the right forearm that was consistent with a  sebaceous cyst.   ASSESSMENT:  1.  Left cerebrovascular accident with spastic hemiparesis.  2.  Depression.   PLAN:  1.  We had a long talk today about his medications, and the patient remains      resistant to resuming his Coumadin and Celexa.  I did refill the      prescriptions for Celexa 40 mg a day and Coumadin 10 mg one-half to one      tablet alternating every other day.  Hopefully with more discussion      amongst the family and involvement of his PCP, we can get [name] [name]  back      on this regimen.  2.  We will continue his Dantrium for now.  I am not sure as to why actually      he chose to continue with the Dantrium and not the other medications.  3.  I have nothing further to offer at this point.  I am happy to see the      patient back as needed in the future.  He will need to follow up with      Dr. Burnett Sheng for his basic medical care.      Ranelle Oyster, M.D.  Electronically Signed     ZTS/MedQ  D:  01/17/2006 11:37:07  T:  01/17/2006 11:53:44  Job #:  [id number]   cc:   [name] [name]  Fax: [phone]

## 2010-11-09 NOTE — Assessment & Plan Note (Signed)
MEDICAL RECORD NUMBER:  [id number]   DATE OF BIRTH:  [birth year]   DATE OF VISIT:  December 21, 2004   INTERVAL HISTORY:  [name] [name] is back regarding his stroke with left-sided  spasticity and aphasia.  He has been involved in [employer] and  been working on multiple techniques including some movement related  techniques which have been helping some of his spontaneous speech.  He is  repeating words better.  He is still having some tightness in the right  upper extremity.  He did not do very well with Botox.  I titrated the  baclofen off and they felt that there was some difference off the baclofen  but not enough to restart the medication.  He still would like to have more  use out of the right hand.  Patient denies any pain today.  Right shoulder  has been stable.   SOCIAL HISTORY:  Wife continues to work and remains supportive.  Patient  does some tasks around the house.  He is able to ride the tractor and cut  the grass.   REVIEW OF SYSTEMS:  The patient denies any neurological or psychiatric  changes.  Denies any constitutional, GU, GI, cardiorespiratory problems.   PHYSICAL EXAMINATION:  Blood pressure is 102/59, pulse of 73, respiratory  rate 16, he is saturating 100% in room air.  Patient is pleasant in no acute  distress today.  His affect is bright and appropriate.  He still has  wordfinding deficits and often repeats words and perseverates.  Was able to  demonstrate needs better through gestures and eye contact with his wife.  Patient remains tight at the wrist today particularly at 2/4.  Shoulder and  elbow are tight at 1+ to 2/4.  Right lower extremity strength is 4/5.  Right  upper extremity strength is 1+/5.  Reflexes are hyperactive.   ASSESSMENT:  1.  Left cerebrovascular accident with right spastic hemiparesis.  2.  Expressive aphasia.  3.  Adhesive capsulitis of the right shoulder with old right shoulder      injury.  4.  Possible erectile  dysfunction.   PLAN:  1.  We will begin a trial of Dantrium 25 mg at bedtime increasing to 25 mg      q.i.d. over 3 weeks' time.  2.  Continue Celexa for mood control.  He is on 40 mg at bedtime.  3.  Continue speech therapies.  4.  I gave the patient a trial prescription of Viagra for perceived erectile      dysfunction.  We used a 50 mg dose.  5.  I will see the patient back in 6 weeks' time.       ZTS/MedQ  D:  12/21/2004 16:20:39  T:  12/21/2004 18:22:19  Job #:  [id number]

## 2010-11-09 NOTE — Procedures (Signed)
[name] [name], [name] [name]              ACCOUNT NO.:  [id number]   MEDICAL RECORD NO.:  [id number]          PATIENT TYPE:  REC   LOCATION:  TPC                          FACILITY:  [facility]   PHYSICIAN:  Ranelle Oyster, M.D.DATE OF BIRTH:  [birth year]   DATE OF PROCEDURE:  04/10/2004  DATE OF DISCHARGE:                                 OPERATIVE REPORT   DIAGNOSIS:  Diagnosis is spastic hemiparesis, dominant side. ICD-9 is  342.11.   PROCEDURE:  Botox injection.   DESCRIPTION OF PROCEDURE:  After informed consent and sterile preparation,  we injected the right pectoralis major using 200 units of botulinum toxin  type A at 2 separate access points, in the belly of the right pectoralis  major. We used 1-1/2-inch 22-gauge needles. A hundred units of Botox was  diluted in 1 cc of preservative-free normal saline in each syringe.   The patient tolerated both of the injections well. Postoperative  instructions were given.   We discussed mood and agitation today, and I think the patient would benefit  from a trial of Tegretol which we will titrate over one week's time to 100  mg t.i.d. He will continue with Celexa at 40 mg q.d.   I will see the patient back in about two months' time.       ZTS/MEDQ  D:  04/10/2004 15:10:21  T:  04/11/2004 07:43:39  Job:  [id number]

## 2010-11-09 NOTE — Discharge Summary (Signed)
NAMEGRAYLING, SCHRANZ                        ACCOUNT NO.:  [id number]   MEDICAL RECORD NO.:  [id number]                   PATIENT TYPE:  IPS   LOCATION:  4030                                 FACILITY:  [facility]   PHYSICIAN:  Ranelle Oyster, M.D.             DATE OF BIRTH:  1951/06/22   DATE OF ADMISSION:  08/20/2002  DATE OF DISCHARGE:  08/27/2002                                 DISCHARGE SUMMARY   DISCHARGE DIAGNOSES:  1. 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  2. Dysphagia with aphagia and apraxia secondary to cerebrovascular accident.  3. Subcutaneous Lovenox for deep vein thrombosis prophylaxis.  4. History of tobacco and alcohol abuse.  5. Depression.  6. Biceps tendonitis.   HISTORY OF PRESENT ILLNESS:  A 60 year old right-handed white male who  presented to [facility] after transfer from [facility] February 23 with right-sided weakness and slurred speech.  Upon  evaluation, cranial CT scan was negative for acute changes.  MRI with acute  infarction left insular cortex.  Carotid duplex negative.  Venous Doppler  study lower extremities negative.  Echocardiogram with ejection fraction of  55%-65% with normal left ventricular function.  EKG normal sinus rhythm.  Neurology consult, Dr. Sandria Manly.  Placed on aspirin and subcutaneous Lovenox.  Modified barium swallow February 26.  Placed on a mechanical soft nectar-  thick liquid diet.  MRA with left ICA occlusion.  Underwent arteriogram per  radiology services showing no distal reconstruction from ECA.  Post  angiogram with arterial Doppler lower extremities for decreased pulses on  the right showing only mild reduction in flow.  Minimal assistance for  sitting, balance, and rolling to the right.  Latest chemistries  unremarkable.  Admitted for a comprehensive rehab program.   PAST MEDICAL HISTORY:  1. Tobacco abuse.  2. Questionable alcohol.  The patient's family had stated  approximately one     pint on the weekends.   PAST SURGICAL HISTORY:  1. Right shoulder surgery.  2. Left elbow surgery.  3. Appendectomy.   ALLERGIES:  CODEINE.   He receives his medical care through the [facility] in [place],  [place].   MEDICATIONS PRIOR TO ADMISSION:  None.   SOCIAL HISTORY:  He lives with wife in [place].  Independent prior to  admission.  He is a Restaurant manager, fast food.  One-level home, five steps to  entry.  Family works day shift.   HOSPITAL COURSE:  The patient with slow progressive gains while on  rehabilitation services with therapies initiated on a b.i.d. basis.  The  following issues were followed during the patient's rehab course.  Pertaining to the patient's left middle cerebral artery infarction, right-  sided weakness continued to show steady gains.  He was fitted with an AFO to  the right lower extremity.  He was apraxic and exhibited some increased tone  to the right side with baclofen  adjusted accordingly.  He had some ongoing  right shoulder and biceps tenderness.  He did receive injection per Dr.  Riley Kill with relatively good results.  He had initially been placed on Vioxx.  This had been since discontinued.  He remained on subcutaneous Lovenox  throughout his rehab stay for deep vein thrombosis prophylaxis.  His blood  pressures remained controlled without the use of antihypertensive  medications.  He had no gastrointestinal complaints, maintained on Pepcid.  He received routine toileting schedules, being continent of bowel and  bladder.  Early on in his hospital course, he was in need of side rails for  his overall safety.  Noted to have one fall that was not witnessed with x-  rays of right shoulder and elbow showing no signs of dislocation or  fracture.  He did receive a followup cranial CT scan on August 23, 2002  showing no associated hemorrhage and full evolved infarction.  His Foley  catheter has since been removed.  As noted  above, he was now continent of  bladder.  A long hospital stay complicated by the patient's stroke  precipitated in hospital depression.  He was placed on Celexa with  relatively good results as his mood and spirits continued to improve  throughout his rehab course.  His wife received ongoing family teaching.  He  received a weekend pass to the [place] apartment that went well.  Plan would be  for ongoing therapies at home.   Overall for his functional mobility, he was minimal assist for his lower  body bathing, needing cuing upper body bathing; minimal assist for both  dressing lower body and toileting, as well as transfers.  Ambulation at this  time was total assist of two.  He was supervision for his wheelchair  mobility.   LABORATORY DATA:  Latest labs showed a hemoglobin of 13.9, hematocrit 40.3,  platelets 351,000.  Sodium 139, potassium 4, BUN 15, creatinine 1.   DISCHARGE MEDICATIONS:  1. Ecotrin 325 mg daily.  2. Elavil 10 mg at bedtime.  3. Celexa 20 mg at bedtime.  4. Zocor 20 mg daily.  5. Pepcid 20 mg daily.  6. Baclofen 15 mg three times daily.  7. Oxycodone immediate release as needed for pain.   ACTIVITY:  As tolerated.   DIET:  As per speech therapy.    SPECIAL INSTRUCTIONS:  Home health physical and occupational therapy.  The  patient should follow up with Dr. Faith Rogue at the [facility].     Mariam Dollar, P.A.                     Ranelle Oyster, M.D.    DA/MEDQ  D:  10/06/2002  T:  10/06/2002  Job:  [id number]   cc:   Genene Churn. Love, M.D.  [address]  [address]  [place]  [place] [address]  Fax: [phone]   [facility]  [place], [place]

## 2010-11-09 NOTE — Assessment & Plan Note (Signed)
MEDICAL RECORD NUMBER:  [id number]   [name] [name] is back regarding his left sided spasticity. We started him on  Dantrium on June 30 and he has had good results with the medication. He has  had some mild fatigue but nothing that inhibits his daily function. Mood has  been fair. He has not used the Viagra at this point. The patient's trying to  use the right hand for more activity. He notes less tightness particularly  at the right elbow.   SOCIAL HISTORY:  Wife is supportive and they seem to be doing well from a  relationship standpoint. The patient still helps around the house.   REVIEW OF SYMPTOMS:  The patient denies any new changes. Pertinent positives  listed above. Fourteen point review of systems is written in the health and  history section of the chart.   PHYSICAL EXAMINATION:  Blood pressure 106/67, pulse 81, respiratory rate 16,  sating 96% on room air. The patient is pleasant in no acute distress. He  remains grossly aphasic and perseverates at times. The right upper extremity  tone is much improved with trace to 1/4 tone at the wrist, fingers are 1/4.  Shoulder and elbow are trace out of 4. The right lower extremity tone is  trace. Motor function is generally 4/5 in the right leg and 1+/5 right upper  extremity.   ASSESSMENT:  1.  Left cerebrovascular accident spastic right hemiparesis improved with      Dantrium.  2.  Expressive aphasia.  3.  History of adhesive capsulitis of the right shoulder.  4.  Questionable erectile dysfunction.   PLAN:  1.  Increased Dantrium to 50 mg t.i.d. Would like to check LFT's first today      before officially increasing the dose.  2.  Continue Celexa for mood.  3.  Viagra p.r.n.  4.  Consider Botox injection to the forearm this fall.  5.  Consider constraint induced therapies.  6.  I will see the patient back in three months time.      Ranelle Oyster, M.D.  Electronically Signed     ZTS/MedQ  D:  02/01/2005 16:31:31  T:   02/01/2005 20:14:29  Job #:  [id number]

## 2011-01-07 ENCOUNTER — Ambulatory Visit: Payer: Self-pay | Admitting: Pain Medicine

## 2011-01-24 ENCOUNTER — Ambulatory Visit: Payer: Self-pay | Admitting: Pain Medicine

## 2011-02-06 ENCOUNTER — Ambulatory Visit: Payer: Self-pay | Admitting: Pain Medicine

## 2011-02-12 ENCOUNTER — Ambulatory Visit: Payer: Self-pay | Admitting: Pain Medicine

## 2011-03-06 ENCOUNTER — Ambulatory Visit: Payer: Self-pay | Admitting: Pain Medicine

## 2011-03-21 ENCOUNTER — Ambulatory Visit: Payer: Self-pay | Admitting: Pain Medicine

## 2011-04-15 ENCOUNTER — Ambulatory Visit: Payer: Self-pay | Admitting: Pain Medicine

## 2011-05-23 ENCOUNTER — Ambulatory Visit: Payer: Self-pay | Admitting: Pain Medicine

## 2011-06-26 ENCOUNTER — Ambulatory Visit: Payer: Self-pay | Admitting: Pain Medicine

## 2011-07-04 ENCOUNTER — Ambulatory Visit: Payer: Self-pay | Admitting: Pain Medicine

## 2011-07-22 ENCOUNTER — Ambulatory Visit: Payer: Self-pay | Admitting: Pain Medicine

## 2012-09-23 ENCOUNTER — Other Ambulatory Visit: Payer: Self-pay

## 2012-09-23 ENCOUNTER — Telehealth: Payer: Self-pay

## 2012-09-23 MED ORDER — LEVETIRACETAM 500 MG PO TABS: 500.0000 mg | ORAL_TABLET | Freq: Two times a day (BID) | ORAL | Status: DC

## 2012-09-23 NOTE — Telephone Encounter (Signed)
Spouse called and left a message saying the nurse was supposed to call in Keppra to last until appt in Oct.  I have sent rx.

## 2012-09-23 NOTE — Telephone Encounter (Signed)
Rx was not e-scribing.  I resent it.  JCB

## 2013-01-25 ENCOUNTER — Ambulatory Visit: Payer: Self-pay | Admitting: Family Medicine

## 2013-03-08 ENCOUNTER — Encounter: Payer: Self-pay | Admitting: Diagnostic Neuroimaging

## 2013-03-29 ENCOUNTER — Encounter: Payer: Self-pay | Admitting: Diagnostic Neuroimaging

## 2013-03-29 ENCOUNTER — Ambulatory Visit (INDEPENDENT_AMBULATORY_CARE_PROVIDER_SITE_OTHER): Payer: Medicare Other | Admitting: Diagnostic Neuroimaging

## 2013-03-29 VITALS — BP 94/66 | HR 72 | Ht 69.0 in | Wt 145.0 lb

## 2013-03-29 DIAGNOSIS — I639 Cerebral infarction, unspecified: Secondary | ICD-10-CM | POA: Insufficient documentation

## 2013-03-29 DIAGNOSIS — R252 Cramp and spasm: Secondary | ICD-10-CM | POA: Insufficient documentation

## 2013-03-29 DIAGNOSIS — G40909 Epilepsy, unspecified, not intractable, without status epilepticus: Secondary | ICD-10-CM | POA: Insufficient documentation

## 2013-03-29 DIAGNOSIS — R259 Unspecified abnormal involuntary movements: Secondary | ICD-10-CM

## 2013-03-29 DIAGNOSIS — I6340 Cerebral infarction due to embolism of unspecified cerebral artery: Secondary | ICD-10-CM

## 2013-03-29 MED ORDER — LEVETIRACETAM 750 MG PO TABS: 750.0000 mg | ORAL_TABLET | Freq: Two times a day (BID) | ORAL | Status: DC

## 2013-03-29 NOTE — Progress Notes (Signed)
[facility]  PATIENT: [name] [name] DOB: 1950/08/22  REFERRING CLINICIAN:  HISTORY FROM: patient and wife REASON FOR VISIT: follow up   HISTORICAL  CHIEF COMPLAINT:  No chief complaint on file.   HISTORY OF PRESENT ILLNESS:   UPDATE 03/29/13: Since last visit (2012), had a breakthrough sz in summer 2014 (?june). Mouth chewing movements x . No generalized convulsions. Still on LEV 500mg  BID. Complaining of spasticity/pain in right hand/arm, with difficulty in maintaining hygiene.  PRIOR HPI: 62 year old male with history of hypercholesterolemia, depression and left MCA stroke in 2004. The patient has moderate to severe expressive aphasia and his wife provides the majority of the history.  2004 patient was admitted and evaluated for left MCA stroke. He was found to have an occluded left internal carotid artery. 2-D echocardiogram showed no cardiac source of emboli. He was discharged on aspirin 325 per day.  In 2007 he was diagnosed with DVT and pulmonary embolism; at that point he was started on Coumadin.  On Nov 05, 2009 at 5:30 in the morning, patient's wife woke up due to patient having convulsions. She noted that his eyes were rolled back and he was making odd movements of his mouth. This lasted for 10-15 minutes. He was very confused and sleepy for several hours afterwards. He presented to this hospital, was evaluated with CT scan of the head and lab work which were unremarkable. He eventually returned to his baseline mental and physical status. He was discharged on Keppra 500 mg twice a day.  Since discharge he continued to have some mild headaches.  He feels that the medication (levetiracetam) is aggravating his headaches. Otherwise she has had no further seizure activity.  REVIEW OF SYSTEMS: Full 14 system review of systems performed and notable only for snoring, urination problems, seizure.  ALLERGIES: Allergies  Allergen Reactions  . Codeine      HOME MEDICATIONS: Outpatient Prescriptions Prior to Visit  Medication Sig Dispense Refill  . citalopram (CELEXA) 40 MG tablet Take 40 mg by mouth daily.      . dantrolene (DANTRIUM) 25 MG capsule Take 25 mg by mouth daily.      Marland Kitchen levETIRAcetam (KEPPRA) 500 MG tablet Take 1 tablet (500 mg total) by mouth 2 (two) times daily.  60 tablet  6  . Warfarin Sodium (COUMADIN PO) Take by mouth.       No facility-administered medications prior to visit.    PAST MEDICAL HISTORY: Past Medical History  Diagnosis Date  . Seizures   . Stroke   . Depression   . High cholesterol     PAST SURGICAL HISTORY: Past Surgical History  Procedure Laterality Date  . Neck surgery    . Lower back    . Elbow surgery      FAMILY HISTORY: No family history on file.  SOCIAL HISTORY:  History   Social History  . Marital Status: Married    Spouse Name: Jeanice Lim    Number of Children: 2  . Years of Education: 11th   Occupational History  .      n/a   Social History Main Topics  . Smoking status: Current Every Day Smoker  . Smokeless tobacco: Not on file  . Alcohol Use: No  . Drug Use: No  . Sexual Activity: Not on file   Other Topics Concern  . Not on file   Social History Narrative   Patient lives with spouse.   Caffeine Use: 3 cups daily  PHYSICAL EXAM  Filed Vitals:   03/29/13 1448  BP: 94/66  Pulse: 72  Height: 5\' 9"  (1.753 m)  Weight: 145 lb (65.772 kg)    Not recorded    Body mass index is 21.4 kg/(m^2).  GENERAL EXAM: General: Patient is awake, alert and in no acute distress.  Well developed and groomed. Ears, Nose and Throat: Neck is supple. Cardiovascular: No carotid artery bruits.  Heart is regular rate and rhythm with no murmurs.  Neurologic Exam  Mental Status: Awake, alert.  MOD-SEVERE EXP APHASIA.  SAYS "YEA BUDDY; OK" easily, but not much else.  CANNOT NAME PEN. CANNOT REPEAT PEN. Comprehension intact.  FOLLOWS MOST COMMANDS EASILY. Cranial Nerves:  Pupils are equal and reactive to light.  Visual fields are full to confrontation.  Conjugate eye movements are full and symmetric.  DECREASED RIGHT FACIAL SENSATION.  DECREASED RIGHT LOWER FACIAL STRENGTH.  Hearing is intact.  Palate elevated symmetrically and uvula is midline.  Shoulder shug is DECREASED ON RIGHT.  Tongue is midline. Motor: Normal bulk and tone EXCEPT FOR INCREASED TONE AND ATROPHY ON THE RUE AND RLE.  Full strength in the upper and lower extremities ON LEFT. RUE IS FLEXED (DELTOID 3, BICEPS 3, TRICEPS 2, GRIP 3). RLE IS EXTENDED (4/5). Sensory: DECREASED ON RIGHT SIDE TO ALL MODALITIES.  LEFT SIDE NORMAL. Coordination: No ataxia or dysmetria on finger-nose or rapid alternating movement testing ON LUE. Gait and Station: CAUTIOUS, SPASTIC GAIT WITH RIGHT HEMI-PARESIS. Reflexes: Deep tendon reflexes in the upper and lower extremity are present and BRISK ON THE RIGHT SIDE.   DIAGNOSTIC DATA (LABS, IMAGING, TESTING) - I reviewed patient records, labs, notes, testing and imaging myself where available.  Lab Results  Component Value Date   WBC 5.7 11/05/2009   HGB 14.2 11/05/2009   HCT 41.8 11/05/2009   MCV 86.3 11/05/2009   PLT 207 11/05/2009      Component Value Date/Time   NA 139 11/05/2009 0812   K 3.9 11/05/2009 0812   CL 109 11/05/2009 0812   CO2 26 11/05/2009 0812   GLUCOSE 102* 11/05/2009 0812   BUN 8 11/05/2009 0812   CREATININE 0.94 11/05/2009 0812   CALCIUM 8.9 11/05/2009 0812   PROT 6.3 11/05/2009 0812   ALBUMIN 3.3* 11/05/2009 0812   AST 29 11/05/2009 0812   ALT 18 11/05/2009 0812   ALKPHOS 102 11/05/2009 0812   BILITOT 0.5 11/05/2009 0812   GFRNONAA >60 11/05/2009 0812   GFRAA  Value: >60        The eGFR has been calculated using the MDRD equation. This calculation has not been validated in all clinical situations. eGFR's persistently <60 mL/min signify possible Chronic Kidney Disease. 11/05/2009 0812   No results found for this basename: CHOL, HDL, LDLCALC, LDLDIRECT,  TRIG, CHOLHDL   No results found for this basename: HGBA1C   No results found for this basename: VITAMINB12   No results found for this basename: TSH    11/25/09 MRI brain 1. Chronic left middle cerebral artery ischemic infarction, with cystic encephalomalacia and gliosis. 2. Left internal carotid artery occlusion.  11/30/09 EEG 1. Mild left hemispheric slowing consistent with prior left MCA infarction. 2. No epileptiform activity or seizures are seen.   ASSESSMENT AND PLAN  62 y.o. male with history of left MCA stroke and left carotid artery occlusion (Feb 2004),  first seizure of his life on Nov 05, 2009 due to gliosis from prior stroke.    PLAN: 1. incr LEV to 750mg   BID for sz control 2. Continue plavix 75mg  for stroke prevention 3. Refer to Dr. Terrace Arabia or Dr. Hosie Poisson for spasticity/botox eval/treatment  Orders Placed This Encounter  Procedures  . Needle electromyography w/ chemodenervation    Meds ordered this encounter  Medications  . levETIRAcetam (KEPPRA) 750 MG tablet    Sig: Take 1 tablet (750 mg total) by mouth 2 (two) times daily.    Dispense:  60 tablet    Refill:  12    Return in about 1 year (around 03/29/2014) for with Edison Nasuti, MD 03/29/2013, 3:08 PM Certified in Neurology, Neurophysiology and Neuroimaging  [facility] [address] [place], [place] [address] [phone]

## 2013-06-14 ENCOUNTER — Telehealth: Payer: Self-pay | Admitting: Diagnostic Neuroimaging

## 2013-06-14 NOTE — Telephone Encounter (Signed)
Called patient for more clarification on type of injections, lt VM message for return call

## 2013-06-14 NOTE — Telephone Encounter (Signed)
See plan from last note:   -Refer to Dr. Terrace Arabia or Dr. Hosie Poisson for spasticity/botox eval/treatment  -VRP

## 2013-07-29 ENCOUNTER — Encounter (INDEPENDENT_AMBULATORY_CARE_PROVIDER_SITE_OTHER): Payer: Self-pay

## 2013-07-29 ENCOUNTER — Ambulatory Visit (INDEPENDENT_AMBULATORY_CARE_PROVIDER_SITE_OTHER): Payer: Medicare Other | Admitting: Neurology

## 2013-07-29 ENCOUNTER — Encounter: Payer: Self-pay | Admitting: Neurology

## 2013-07-29 VITALS — BP 112/65 | HR 59 | Ht 69.75 in | Wt 149.0 lb

## 2013-07-29 DIAGNOSIS — G8110 Spastic hemiplegia affecting unspecified side: Secondary | ICD-10-CM

## 2013-07-29 MED ORDER — ONABOTULINUMTOXINA 100 UNITS IJ SOLR: 300.0000 [IU] | Freq: Once | INTRAMUSCULAR | Status: DC

## 2013-07-29 NOTE — Progress Notes (Signed)
[facility]   Provider:  Dr Janann Colonel Referring Provider: [place] Pink, MD Primary Care Physician:  [place] Pink, MD  CC:  Spastic hemiplegia  HPI:  [name] [name] is a 63 y.o. male here as initial evaluation for botulinum toxin injections. Had CVA around 55yrs ago, resulted in limited use of right side, RUE gets flexed posture, RLE gets extended posture. Notes difficulty cleaning his hand, taking clothing off, has difficulty walking.   Contraindications and precautions discussed with patient. EMG guidance was used to inject muscles detailed below. Aseptic procedure was observed and patient tolerated procedure. Procedure performed by Dr. Jim Like.  The condition has existed for more than 6 months, and pt does not have a diagnosis of ALS, Myasthenia Gravis or Lambert-Eaton Syndrome.  Risks and benefits of injections discussed and pt agrees to proceed with the procedure.  Written consent obtained These injections are medically necessary. He receives good benefits from these injections. These injections do not cause sedations or hallucinations which the oral therapies may cause.  Indication/Diagnosis:  Type of toxin: Botox  Lot # [id number] x 3  Expiration date:Aug 2017  Injection sites: R pec major: 30 R biceps: 30 R brachioradialis: 20 R pronator quadratus: 10 R pronator teres: 30 R FPL: 10 R FDS: 30 R FDP: 30  R medial hamstring: 30 R lateral hamstring: 25 R tibialias posterior: 30 R quad plate: 10    History   Social History  . Marital Status: Married    Spouse Name: Earnest Bailey    Number of Children: 2  . Years of Education: 11th   Occupational History  .      n/a   Social History Main Topics  . Smoking status: Current Every Day Smoker  . Smokeless tobacco: Not on file  . Alcohol Use: Yes     Comment: occ  . Drug Use: No  . Sexual Activity: Not on file   Other Topics Concern  . Not on file   Social History Narrative   Patient lives  with spouse., has 2 children   Patient was right handed, learning to use left hand now   Education level is 11th grade   Caffeine Use: 3 cups daily    No family history on file.  Past Medical History  Diagnosis Date  . Seizures   . Stroke   . Depression   . High cholesterol     Past Surgical History  Procedure Laterality Date  . Neck surgery    . Lower back    . Elbow surgery      Current Outpatient Prescriptions  Medication Sig Dispense Refill  . citalopram (CELEXA) 40 MG tablet Take 40 mg by mouth daily.      . clopidogrel (PLAVIX) 75 MG tablet       . dantrolene (DANTRIUM) 25 MG capsule Take 25 mg by mouth daily.      Marland Kitchen escitalopram (LEXAPRO) 20 MG tablet       . levETIRAcetam (KEPPRA) 750 MG tablet Take 1 tablet (750 mg total) by mouth 2 (two) times daily.  60 tablet  12  . rosuvastatin (CRESTOR) 20 MG tablet Take 20 mg by mouth daily.       No current facility-administered medications for this visit.    Allergies as of 07/29/2013 - Review Complete 07/29/2013  Allergen Reaction Noted  . Codeine  03/08/2013    Vitals: BP 112/65  Pulse 59  Ht 5' 9.75" (1.772 m)  Wt 149 lb (67.586 kg)  BMI  21.52 kg/m2 Last Weight:  Wt Readings from Last 1 Encounters:  07/29/13 149 lb (67.586 kg)   Last Height:   Ht Readings from Last 1 Encounters:  07/29/13 5' 9.75" (1.772 m)      Assessment:  After physical and neurologic examination, review of laboratory studies, imaging, neurophysiology testing and pre-existing records, assessment will be reviewed on the problem list.  Plan:  Treatment plan and additional workup will be reviewed under Problem List.  JOSHIAH TRAYNHAM is a 63 y.o. male here for botulinum toxin injections. They tolerated procedure well with no complications.   1) Botox injections as detailed above. A total of 295 units was used. 5 units wasted. Will order 300 units for next visit. 2) Tylenol or Motrin for injection site pain. 3) Medication guide  dispensed. 4) Follow up for repeat injections in 3 months 5)Referral placed for OT   Jim Like, DO  [facility] [address] [place], [place] [address]  Phone [phone] Fax [phone]

## 2013-08-13 NOTE — Addendum Note (Signed)
Addended by: Drema Dallas on: 08/13/2013 02:18 PM   Modules accepted: Orders

## 2013-11-05 ENCOUNTER — Ambulatory Visit: Payer: Self-pay | Admitting: Physician Assistant

## 2014-03-17 ENCOUNTER — Ambulatory Visit: Payer: Self-pay | Admitting: Pain Medicine

## 2014-04-04 ENCOUNTER — Ambulatory Visit: Payer: Self-pay | Admitting: Pain Medicine

## 2014-08-15 ENCOUNTER — Other Ambulatory Visit: Payer: Self-pay | Admitting: Diagnostic Neuroimaging

## 2014-08-16 NOTE — Telephone Encounter (Signed)
I called and spoke with Ms [name] [name] who is aware an annual appt is needed.  They will check their calendar and call us back to schedule.

## 2015-01-09 ENCOUNTER — Telehealth: Payer: Self-pay | Admitting: Diagnostic Neuroimaging

## 2015-01-09 MED ORDER — LEVETIRACETAM 750 MG PO TABS: 750.0000 mg | ORAL_TABLET | Freq: Two times a day (BID) | ORAL | Status: DC

## 2015-01-09 NOTE — Telephone Encounter (Signed)
Rx has been sent.  I called the patient back to advise.  Phone rang nearly 20 times with no answer, no option to leave message.

## 2015-01-09 NOTE — Telephone Encounter (Signed)
Patient has an upcoming apt 7/27 but will run out of his medication prior to that.  He needs generic Keppra 75 mg - He uses [facility] on [address] [phone].  Best call back number is [phone]

## 2015-01-18 ENCOUNTER — Ambulatory Visit (INDEPENDENT_AMBULATORY_CARE_PROVIDER_SITE_OTHER): Payer: Medicare Other | Admitting: Diagnostic Neuroimaging

## 2015-01-18 ENCOUNTER — Encounter: Payer: Self-pay | Admitting: Diagnostic Neuroimaging

## 2015-01-18 VITALS — BP 112/68 | HR 68 | Ht 69.0 in | Wt 147.0 lb

## 2015-01-18 DIAGNOSIS — I6340 Cerebral infarction due to embolism of unspecified cerebral artery: Secondary | ICD-10-CM | POA: Diagnosis not present

## 2015-01-18 DIAGNOSIS — I639 Cerebral infarction, unspecified: Secondary | ICD-10-CM

## 2015-01-18 DIAGNOSIS — G40909 Epilepsy, unspecified, not intractable, without status epilepticus: Secondary | ICD-10-CM | POA: Diagnosis not present

## 2015-01-18 MED ORDER — LEVETIRACETAM 750 MG PO TABS: 750.0000 mg | ORAL_TABLET | Freq: Two times a day (BID) | ORAL | Status: DC

## 2015-01-18 NOTE — Progress Notes (Signed)
[facility]  PATIENT: [name] [name] DOB: Nov 21, 1962  REFERRING CLINICIAN:  HISTORY FROM: patient and wife REASON FOR VISIT: follow up   HISTORICAL  CHIEF COMPLAINT:  Chief Complaint  Patient presents with  . Seizures    rm 6, wife - [place]  . Follow-up    HISTORY OF PRESENT ILLNESS:   UPDATE 01/18/15: Since last visit, no seizures. Tried botox for spasticity, but didn't help. Continues on LEV 786m BID.  UPDATE 03/29/13: Since last visit (2012), had a breakthrough sz in summer 2014 (?june). Mouth chewing movements x 159m. No generalized convulsions. Still on LEV 50029mID. Complaining of spasticity/pain in right hand/arm, with difficulty in maintaining hygiene.  PRIOR HPI: 64 23ar old male with history of hypercholesterolemia, depression and left MCA stroke in 2004. The patient has moderate to severe expressive aphasia and his wife provides the majority of the history.  2004 patient was admitted and evaluated for left MCA stroke. He was found to have an occluded left internal carotid artery. 2-D echocardiogram showed no cardiac source of emboli. He was discharged on aspirin 325 per day.  In 2007 he was diagnosed with DVT and pulmonary embolism; at that point he was started on Coumadin.  On Nov 05, 2009 at 5:30 in the morning, patient's wife woke up due to patient having convulsions. She noted that his eyes were rolled back and he was making odd movements of his mouth. This lasted for 10-15 minutes. He was very confused and sleepy for several hours afterwards. He presented to this hospital, was evaluated with CT scan of the head and lab work which were unremarkable. He eventually returned to his baseline mental and physical status. He was discharged on Keppra 500 mg twice a day.  Since discharge he continued to have some mild headaches.  He feels that the medication (levetiracetam) is aggravating his headaches. Otherwise she has had no further seizure  activity.  REVIEW OF SYSTEMS: Full 14 system review of systems performed and notable only for headache diff urinating cough.   ALLERGIES: Allergies  Allergen Reactions  . Celebrex [Celecoxib] Nausea And Vomiting  . Codeine     HOME MEDICATIONS: Outpatient Prescriptions Prior to Visit  Medication Sig Dispense Refill  . clopidogrel (PLAVIX) 75 MG tablet     . dantrolene (DANTRIUM) 25 MG capsule Take 25 mg by mouth daily.    . eMarland Kitchencitalopram (LEXAPRO) 20 MG tablet     . levETIRAcetam (KEPPRA) 750 MG tablet Take 1 tablet (750 mg total) by mouth 2 (two) times daily. 60 tablet 0  . rosuvastatin (CRESTOR) 20 MG tablet Take 20 mg by mouth daily.    . citalopram (CELEXA) 40 MG tablet Take 40 mg by mouth daily.     Facility-Administered Medications Prior to Visit  Medication Dose Route Frequency Provider Last Rate Last Dose  . botulinum toxin Type A (BOTOX) injection 300 Units  300 Units Intramuscular Once PetDrema DallasO        PAST MEDICAL HISTORY: Past Medical History  Diagnosis Date  . Seizures   . Stroke   . Depression   . High cholesterol     PAST SURGICAL HISTORY: Past Surgical History  Procedure Laterality Date  . Neck surgery    . Lower back    . Elbow surgery      FAMILY HISTORY: History reviewed. No pertinent family history.  SOCIAL HISTORY:  History   Social History  . Marital Status: Married    Spouse Name: [place]  Number of Children: 2  . Years of Education: 11th   Occupational History  .      n/a   Social History Main Topics  . Smoking status: Current Every Day Smoker -- 1.00 packs/day for 46 years  . Smokeless tobacco: Not on file     Comment: quit for 1 year, restarted  . Alcohol Use: Yes     Comment: occ  . Drug Use: No  . Sexual Activity: Not on file   Other Topics Concern  . Not on file   Social History Narrative   Patient lives with spouse., has 2 children   Patient was right handed, learning to use left hand now   Education  level is 11th grade   Caffeine Use: 3 cups daily     PHYSICAL EXAM  Filed Vitals:   01/18/15 1344  BP: 112/68  Pulse: 68  Height: 5' 9"  (1.753 m)  Weight: 147 lb (66.679 kg)    Not recorded      Body mass index is 21.7 kg/(m^2).  GENERAL EXAM: General: Patient is awake, alert and in no acute distress.  Well developed and groomed.  Cardiovascular: No carotid artery bruits.  Heart is regular rate and rhythm with no murmurs.  Neurologic Exam  Mental Status: Awake, alert.  MOD-SEVERE EXP APHASIA.  SAYS "YEA BUDDY; OK" easily, but not much else.  CANNOT NAME PEN. CANNOT REPEAT PEN. Comprehension intact.  FOLLOWS MOST COMMANDS EASILY. Cranial Nerves: Pupils are equal and reactive to light.  Visual fields are full to confrontation.  Conjugate eye movements are full and symmetric.  DECREASED RIGHT FACIAL SENSATION.  DECREASED RIGHT LOWER FACIAL STRENGTH.  Hearing is intact.  Palate elevated symmetrically and uvula is midline.  Shoulder shug is DECREASED ON RIGHT.  Tongue is midline. Motor: Normal bulk and tone EXCEPT FOR INCREASED TONE AND ATROPHY ON THE RUE AND RLE.  Full strength in the upper and lower extremities ON LEFT. RUE IS FLEXED (DELTOID 3, BICEPS 3, TRICEPS 2, GRIP 3). RLE IS EXTENDED (4/5). Sensory: DECREASED ON RIGHT SIDE TO ALL MODALITIES.  LEFT SIDE NORMAL. Coordination: No ataxia or dysmetria on finger-nose or rapid alternating movement testing ON LUE. Gait and Station: CAUTIOUS, SPASTIC GAIT WITH RIGHT HEMI-PARESIS. Reflexes: Deep tendon reflexes in the upper and lower extremity are present and BRISK ON THE RIGHT SIDE.    DIAGNOSTIC DATA (LABS, IMAGING, TESTING) - I reviewed patient records, labs, notes, testing and imaging myself where available.  Lab Results  Component Value Date   WBC 5.7 11/05/2009   HGB 14.2 11/05/2009   HCT 41.8 11/05/2009   MCV 86.3 11/05/2009   PLT 207 11/05/2009      Component Value Date/Time   NA 139 11/05/2009 0812   K 3.9  11/05/2009 0812   CL 109 11/05/2009 0812   CO2 26 11/05/2009 0812   GLUCOSE 102* 11/05/2009 0812   BUN 8 11/05/2009 0812   CREATININE 0.94 11/05/2009 0812   CALCIUM 8.9 11/05/2009 0812   PROT 6.3 11/05/2009 0812   ALBUMIN 3.3* 11/05/2009 0812   AST 29 11/05/2009 0812   ALT 18 11/05/2009 0812   ALKPHOS 102 11/05/2009 0812   BILITOT 0.5 11/05/2009 0812   GFRNONAA >60 11/05/2009 0812   GFRAA  11/05/2009 0812    >60        The eGFR has been calculated using the MDRD equation. This calculation has not been validated in all clinical situations. eGFR's persistently <60 mL/min signify possible Chronic Kidney Disease.  No results found for: CHOL No results found for: HGBA1C No results found for: VITAMINB12 No results found for: TSH  11/25/09 MRI brain 1. Chronic left middle cerebral artery ischemic infarction, with cystic encephalomalacia and gliosis. 2. Left internal carotid artery occlusion.  11/30/09 EEG 1. Mild left hemispheric slowing consistent with prior left MCA infarction. 2. No epileptiform activity or seizures are seen.   ASSESSMENT AND PLAN  64 y.o. male with history of left MCA stroke and left carotid artery occlusion (Feb 2004),  first seizure of his life on Nov 05, 2009 due to gliosis from prior stroke.    PLAN: 1. Continue LEV 752m BID for sz control; will ask PCP if they feel comfortable to refill meds since his sz are stable 2. Continue plavix 760mfor stroke prevention  Meds ordered this encounter  Medications  . levETIRAcetam (KEPPRA) 750 MG tablet    Sig: Take 1 tablet (750 mg total) by mouth 2 (two) times daily.    Dispense:  180 tablet    Refill:  4   Return in about 1 year (around 01/18/2016).    VIPenni BombardMD 12/30/36/18292:9:37M Certified in Neurology, Neurophysiology and Neuroimaging  [facility] [address] [phone]

## 2016-01-22 ENCOUNTER — Ambulatory Visit: Payer: Self-pay | Admitting: Diagnostic Neuroimaging

## 2016-10-05 ENCOUNTER — Other Ambulatory Visit: Payer: Self-pay | Admitting: Diagnostic Neuroimaging

## 2016-10-28 ENCOUNTER — Other Ambulatory Visit: Payer: Self-pay | Admitting: Diagnostic Neuroimaging

## 2016-10-28 NOTE — Telephone Encounter (Signed)
LVM requesting call back to schedule follow up (patient last seen 12/2014) so he can continue to get refills on Keppra.  Advised that if he is getting refills through his PCP, to call and let this RN know.  Left number.

## 2016-10-30 NOTE — Telephone Encounter (Signed)
Refilled Keppra x 2 months with pharmacy note to have pt call and schedule follow up. Last seen 12/2014.

## 2016-10-30 NOTE — Telephone Encounter (Signed)
LVM #2 on home phone requesting call back. Called work # listed, received menu but no mention of patient's name. Did not leave message.  Called [employer], spoke w/Tiffany who stated patient called into automated system to request refill on Keppra. She stated he used a very old Rx number, and she has no record of him picking up [place] from [place] in past 2 years.  She gave cell number she has on file, [phone]. When this RN called that number recording stated number no longer in service. Only other number in pt's demographics is for [facility], [phone]. When called it immediately rang busy.

## 2019-09-02 DIAGNOSIS — F17200 Nicotine dependence, unspecified, uncomplicated: Secondary | ICD-10-CM | POA: Diagnosis not present

## 2019-09-02 DIAGNOSIS — R5383 Other fatigue: Secondary | ICD-10-CM | POA: Diagnosis not present

## 2019-09-02 DIAGNOSIS — Z125 Encounter for screening for malignant neoplasm of prostate: Secondary | ICD-10-CM | POA: Diagnosis not present

## 2019-09-02 DIAGNOSIS — G8111 Spastic hemiplegia affecting right dominant side: Secondary | ICD-10-CM | POA: Diagnosis not present

## 2019-09-02 DIAGNOSIS — E785 Hyperlipidemia, unspecified: Secondary | ICD-10-CM | POA: Diagnosis not present

## 2019-09-02 DIAGNOSIS — R5381 Other malaise: Secondary | ICD-10-CM | POA: Diagnosis not present

## 2019-12-06 DIAGNOSIS — J3489 Other specified disorders of nose and nasal sinuses: Secondary | ICD-10-CM | POA: Diagnosis not present

## 2019-12-06 DIAGNOSIS — E785 Hyperlipidemia, unspecified: Secondary | ICD-10-CM | POA: Diagnosis not present

## 2019-12-06 DIAGNOSIS — J449 Chronic obstructive pulmonary disease, unspecified: Secondary | ICD-10-CM | POA: Diagnosis not present

## 2019-12-06 DIAGNOSIS — Z23 Encounter for immunization: Secondary | ICD-10-CM | POA: Diagnosis not present

## 2019-12-06 DIAGNOSIS — Z0000 Encounter for general adult medical examination without abnormal findings: Secondary | ICD-10-CM | POA: Diagnosis not present

## 2019-12-06 DIAGNOSIS — F329 Major depressive disorder, single episode, unspecified: Secondary | ICD-10-CM | POA: Diagnosis not present

## 2020-01-17 DIAGNOSIS — T63441A Toxic effect of venom of bees, accidental (unintentional), initial encounter: Secondary | ICD-10-CM | POA: Diagnosis not present

## 2020-01-17 DIAGNOSIS — R112 Nausea with vomiting, unspecified: Secondary | ICD-10-CM | POA: Diagnosis not present

## 2020-02-08 DIAGNOSIS — L570 Actinic keratosis: Secondary | ICD-10-CM | POA: Diagnosis not present

## 2020-02-08 DIAGNOSIS — D489 Neoplasm of uncertain behavior, unspecified: Secondary | ICD-10-CM | POA: Diagnosis not present

## 2020-04-24 ENCOUNTER — Other Ambulatory Visit: Payer: Self-pay

## 2020-04-24 ENCOUNTER — Emergency Department (HOSPITAL_COMMUNITY): Payer: Medicare HMO

## 2020-04-24 ENCOUNTER — Encounter (HOSPITAL_COMMUNITY): Payer: Self-pay | Admitting: *Deleted

## 2020-04-24 ENCOUNTER — Observation Stay (HOSPITAL_COMMUNITY)
Admission: EM | Admit: 2020-04-24 | Discharge: 2020-04-26 | Disposition: A | Discharge status: Home / Self Care | Payer: Medicare HMO | Attending: Internal Medicine | Admitting: Internal Medicine

## 2020-04-24 DIAGNOSIS — I10 Essential (primary) hypertension: Secondary | ICD-10-CM | POA: Diagnosis not present

## 2020-04-24 DIAGNOSIS — E860 Dehydration: Secondary | ICD-10-CM | POA: Insufficient documentation

## 2020-04-24 DIAGNOSIS — R4182 Altered mental status, unspecified: Principal | ICD-10-CM | POA: Insufficient documentation

## 2020-04-24 DIAGNOSIS — Z23 Encounter for immunization: Secondary | ICD-10-CM | POA: Insufficient documentation

## 2020-04-24 DIAGNOSIS — Z20822 Contact with and (suspected) exposure to covid-19: Secondary | ICD-10-CM | POA: Diagnosis not present

## 2020-04-24 DIAGNOSIS — K8020 Calculus of gallbladder without cholecystitis without obstruction: Secondary | ICD-10-CM | POA: Diagnosis not present

## 2020-04-24 DIAGNOSIS — Z043 Encounter for examination and observation following other accident: Secondary | ICD-10-CM | POA: Diagnosis not present

## 2020-04-24 DIAGNOSIS — I700 Atherosclerosis of aorta: Secondary | ICD-10-CM | POA: Diagnosis not present

## 2020-04-24 DIAGNOSIS — R0902 Hypoxemia: Secondary | ICD-10-CM | POA: Diagnosis not present

## 2020-04-24 DIAGNOSIS — E785 Hyperlipidemia, unspecified: Secondary | ICD-10-CM | POA: Diagnosis not present

## 2020-04-24 DIAGNOSIS — M4319 Spondylolisthesis, multiple sites in spine: Secondary | ICD-10-CM | POA: Diagnosis not present

## 2020-04-24 DIAGNOSIS — R7402 Elevation of levels of lactic acid dehydrogenase (LDH): Secondary | ICD-10-CM | POA: Diagnosis not present

## 2020-04-24 DIAGNOSIS — R109 Unspecified abdominal pain: Secondary | ICD-10-CM | POA: Insufficient documentation

## 2020-04-24 DIAGNOSIS — R4020 Unspecified coma: Secondary | ICD-10-CM | POA: Diagnosis not present

## 2020-04-24 DIAGNOSIS — N179 Acute kidney failure, unspecified: Secondary | ICD-10-CM | POA: Diagnosis present

## 2020-04-24 DIAGNOSIS — J449 Chronic obstructive pulmonary disease, unspecified: Secondary | ICD-10-CM | POA: Diagnosis not present

## 2020-04-24 DIAGNOSIS — F17210 Nicotine dependence, cigarettes, uncomplicated: Secondary | ICD-10-CM | POA: Insufficient documentation

## 2020-04-24 DIAGNOSIS — Z79899 Other long term (current) drug therapy: Secondary | ICD-10-CM | POA: Diagnosis not present

## 2020-04-24 DIAGNOSIS — E872 Acidosis: Secondary | ICD-10-CM | POA: Diagnosis not present

## 2020-04-24 DIAGNOSIS — I4891 Unspecified atrial fibrillation: Secondary | ICD-10-CM | POA: Diagnosis not present

## 2020-04-24 DIAGNOSIS — W19XXXA Unspecified fall, initial encounter: Secondary | ICD-10-CM | POA: Diagnosis not present

## 2020-04-24 DIAGNOSIS — R7989 Other specified abnormal findings of blood chemistry: Secondary | ICD-10-CM

## 2020-04-24 DIAGNOSIS — N261 Atrophy of kidney (terminal): Secondary | ICD-10-CM | POA: Diagnosis not present

## 2020-04-24 MED ORDER — SODIUM CHLORIDE 0.9 % IV BOLUS
1000.0000 mL | Freq: Once | INTRAVENOUS | Status: AC
  Administered 2020-04-24: 1000 mL via INTRAVENOUS

## 2020-04-24 NOTE — ED Provider Notes (Signed)
MSE was initiated and I personally evaluated the patient and placed orders (if any) at  10:57 PM on April 24, 2020.  The patient appears stable so that the remainder of the MSE may be completed by another provider.   Daleen Bo, MD 04/24/20 2257

## 2020-04-24 NOTE — ED Triage Notes (Signed)
Pt brought in by [facility] for unresponsiveness; pt was in bathroom when he fell to the floor and family found him unresponsive; cbg 213; pt given bolus of NS in route by EMS

## 2020-04-24 NOTE — ED Provider Notes (Addendum)
[facility] EMERGENCY DEPARTMENT Provider Note   CSN: [id number] Arrival date & time: 04/24/20  2223     History Chief Complaint  Patient presents with   Altered Mental Status    [name] [name] is a 69 y.o. male.  HPI     This is 69 year old male with a history of seizures, stroke, hyperlipidemia who presents with reported unresponsiveness.  Per EMS, was found on the floor by his family unresponsive.  Blood sugar was 213 in route.  He was given fluid bolus.  Upon arrival there was concern for low blood pressure.  He was screened by my partner and noted to have normal blood pressure normal temperature.  Unfortunately, patient is unable to really contribute to history taking.  He has significant aphasia secondary to his prior stroke.  He is alert on my evaluation.  When asked if he is in pain he nodded his head no.  When asked if he felt short of breath he nodded yes.  Unclear whether he had any recent illnesses.  Level 5 caveat secondary to speech difficulty.  Past Medical History:  Diagnosis Date   Depression    High cholesterol    Seizures ([place])    Stroke [facility])     Patient Active Problem List   Diagnosis Date Noted   Seizure disorder ([place]) 03/29/2013   Spasticity 59/16/3846   Embolic stroke ([place]) 65/99/3570    Past Surgical History:  Procedure Laterality Date   ELBOW SURGERY     lower back     NECK SURGERY         History reviewed. No pertinent family history.  Social History   Tobacco Use   Smoking status: Current Every Day Smoker    Packs/day: 1.00    Years: 46.00    Pack years: 46.00   Tobacco comment: quit for 1 year, restarted  Substance Use Topics   Alcohol use: Yes    Comment: occ   Drug use: No    Home Medications Prior to Admission medications   Medication Sig Start Date End Date Taking? Authorizing Provider  clopidogrel (PLAVIX) 75 MG tablet  07/16/13   [provider]  dantrolene (DANTRIUM) 25 MG capsule Take 25  mg by mouth daily.    [provider]  escitalopram (LEXAPRO) 20 MG tablet  06/28/13   [provider]  levETIRAcetam (KEPPRA) 750 MG tablet TAKE ONE TABLET BY MOUTH TWICE DAILY 10/30/16   Penumalli, Earlean Polka, MD  simvastatin (ZOCOR) 40 MG tablet Take by mouth. 03/28/14 03/28/15  [provider]    Allergies    Celebrex [celecoxib] and Codeine  Review of Systems   Review of Systems  Unable to perform ROS: Other (aphasia)    Physical Exam Updated Vital Signs BP 110/79    Pulse 71    Temp 98.6 F (37 C) (Rectal)    Resp 15    Ht 1.753 m (5\' 9" )    Wt 66.7 kg    SpO2 100%    BMI 21.72 kg/m   Physical Exam Vitals and nursing note reviewed.  Constitutional:      Appearance: He is well-developed.     Comments: Ill-appearing chronically, ABCs intact  HENT:     Head: Normocephalic and atraumatic.     Mouth/Throat:     Mouth: Mucous membranes are dry.  Eyes:     Pupils: Pupils are equal, round, and reactive to light.  Cardiovascular:     Rate and Rhythm: Normal rate and  regular rhythm.     Heart sounds: Normal heart sounds. No murmur heard.   Pulmonary:     Effort: Pulmonary effort is normal. No respiratory distress.     Breath sounds: Normal breath sounds. No wheezing.  Abdominal:     General: Bowel sounds are normal.     Palpations: Abdomen is soft.     Tenderness: There is no abdominal tenderness. There is no rebound.  Musculoskeletal:     Cervical back: Neck supple.     Right lower leg: No edema.     Left lower leg: No edema.  Lymphadenopathy:     Cervical: No cervical adenopathy.  Skin:    General: Skin is warm and dry.  Neurological:     Mental Status: He is alert.     Comments: Alert, unable to assess orientation, he mostly provides nonsensical answers and is difficult to understand, contractured right upper extremity with minimal movement, right lower extremity weakness, about a 5 strength left upper and lower extremities  Psychiatric:         Mood and Affect: Mood normal.     ED Results / Procedures / Treatments   Labs (all labs ordered are listed, but only abnormal results are displayed) Labs Reviewed  COMPREHENSIVE METABOLIC PANEL - Abnormal; Notable for the following components:      Result Value   Sodium 134 (*)    CO2 17 (*)    Glucose, Bld 157 (*)    BUN 26 (*)    Creatinine, Ser 1.51 (*)    Calcium 8.0 (*)    Albumin 2.7 (*)    Alkaline Phosphatase 240 (*)    GFR, Estimated 50 (*)    All other components within normal limits  CBC WITH DIFFERENTIAL/PLATELET - Abnormal; Notable for the following components:   WBC 16.5 (*)    Neutro Abs 13.5 (*)    Monocytes Absolute 1.1 (*)    Abs Immature Granulocytes 0.16 (*)    All other components within normal limits  LACTIC ACID, PLASMA - Abnormal; Notable for the following components:   Lactic Acid, Venous 4.9 (*)    All other components within normal limits  LACTIC ACID, PLASMA - Abnormal; Notable for the following components:   Lactic Acid, Venous 4.1 (*)    All other components within normal limits  URINALYSIS, ROUTINE W REFLEX MICROSCOPIC - Abnormal; Notable for the following components:   Protein, ur 100 (*)    All other components within normal limits  RESPIRATORY PANEL BY RT PCR (FLU A&B, COVID)  CULTURE, BLOOD (ROUTINE X 2)  CULTURE, BLOOD (ROUTINE X 2)  LACTIC ACID, PLASMA  LACTIC ACID, PLASMA    EKG EKG Interpretation  Date/Time:  Monday April 24 2020 23:27:04 EDT Ventricular Rate:  83 PR Interval:    QRS Duration: 118 QT Interval:  423 QTC Calculation: 498 R Axis:   92 Text Interpretation: Sinus rhythm Nonspecific intraventricular conduction delay Borderline T abnormalities, anterior leads Confirmed by Thayer Jew [phone]) on 04/25/2020 6:06:09 AM   Radiology CT ABDOMEN PELVIS WO CONTRAST  Result Date: 04/25/2020 CLINICAL DATA:  Acute nonlocalized abdominal pain EXAM: CT ABDOMEN AND PELVIS WITHOUT CONTRAST TECHNIQUE: Multidetector CT  imaging of the abdomen and pelvis was performed following the standard protocol without IV contrast. COMPARISON:  None similar FINDINGS: Lower chest:  Emphysema, panlobular.  Lower lobe scarring. Hepatobiliary: No focal liver abnormality.Faint calcified gallstones. No evidence of cholecystitis. Pancreas: Unremarkable. Spleen: Unremarkable. Adrenals/Urinary Tract: Negative adrenals. No hydronephrosis or stone. 4  low-density right renal lesions with small size limiting densitometry, only measuring up to 12 mm in size. None of the show detectable solid density. Even smaller left renal cortical lesions are present, with not densitometry possible. Symmetric renal atrophy. Unremarkable bladder given collapse. Stomach/Bowel:  No obstruction. No visible bowel inflammation. Vascular/Lymphatic: No acute vascular abnormality. Scattered atheromatous calcifications of the aorta and iliacs. Coronary calcification. No mass or adenopathy. Reproductive:No pathologic findings. Other: No ascites or pneumoperitoneum. Musculoskeletal: No acute abnormalities. L5 chronic bilateral pars defects. L5-S1 anterolisthesis. Sequela of avulsion injury to the right ischial tuberosity. IMPRESSION: 1. No acute intra-abdominal finding. 2. Cholelithiasis. 3.  Emphysema (ICD10-J43.9). Electronically Signed   By: Monte Fantasia M.D.   On: 04/25/2020 05:56   CT Head Wo Contrast  Result Date: 04/24/2020 CLINICAL DATA:  Mental status change. Found on bathroom floor unresponsive. EXAM: CT HEAD WITHOUT CONTRAST TECHNIQUE: Contiguous axial images were obtained from the base of the skull through the vertex without intravenous contrast. COMPARISON:  Remote head CT 11/05/2009 FINDINGS: Brain: No acute intracranial hemorrhage. Remote left MCA distribution infarct with encephalomalacia throughout the anterior temple, frontal, anterior parietal lobes. Similar findings are seen on prior exam. There is ex vacuo dilatation of the left lateral ventricle. No  hydrocephalus. No evidence of acute ischemia. No subdural or extra-axial collection. Basilar cisterns are patent. Vascular: Atherosclerosis of skullbase vasculature without hyperdense vessel or abnormal calcification. Skull: No fracture. Stable sclerotic density in the left aspect of the clivus, stability for 10 years indicates benign process. No suspicious bone lesion. Sinuses/Orbits: Paranasal sinuses and mastoid air cells are clear. The visualized orbits are unremarkable. Other: None. IMPRESSION: 1. No acute intracranial abnormality. 2. Remote left MCA distribution infarct. Electronically Signed   By: Keith Rake M.D.   On: 04/24/2020 23:53   DG Chest Port 1 View  Result Date: 04/24/2020 CLINICAL DATA:  [place], unresponsive EXAM: PORTABLE CHEST 1 VIEW COMPARISON:  11/05/2009 FINDINGS: Single frontal view of the chest demonstrates an unremarkable cardiac silhouette. No airspace disease, effusion, or pneumothorax. No acute displaced fracture. IMPRESSION: 1. No acute intrathoracic process. Electronically Signed   By: Randa Ngo M.D.   On: 04/24/2020 23:14    Procedures Procedures (including critical care time)  CRITICAL CARE Performed by: Merryl Hacker   Total critical care time: 45 minutes  Critical care time was exclusive of separately billable procedures and treating other patients.  Critical care was necessary to treat or prevent imminent or life-threatening deterioration.  Critical care was time spent personally by me on the following activities: development of treatment plan with patient and/or surrogate as well as nursing, discussions with consultants, evaluation of patient's response to treatment, examination of patient, obtaining history from patient or surrogate, ordering and performing treatments and interventions, ordering and review of laboratory studies, ordering and review of radiographic studies, pulse oximetry and re-evaluation of patient's condition.   Medications  Ordered in ED Medications  lactated ringers infusion ( Intravenous New Bag/Given 04/25/20 0227)  sodium chloride 0.9 % bolus 1,000 mL (0 mLs Intravenous Stopped 04/25/20 0126)  sodium chloride 0.9 % bolus 500 mL (0 mLs Intravenous Stopped 04/25/20 0204)  vancomycin (VANCOCIN) IVPB 1000 mg/200 mL premix (0 mg Intravenous Stopped 04/25/20 0349)  piperacillin-tazobactam (ZOSYN) IVPB 3.375 g (0 g Intravenous Stopped 04/25/20 0251)    ED Course  I have reviewed the triage vital signs and the nursing notes.  Pertinent labs & imaging results that were available during my care of the patient were reviewed by me  and considered in my medical decision making (see chart for details).  Clinical Course as of Apr 25 640  Tue Apr 25, 2020  0201 Lactate noted to be 4.9.  Initially patient was afebrile but with lactate of 4.9 and marginal blood pressures which are responsive to fluids, will cover with sepsis antibiotics.  He was received 2 L of fluid.  30 cc/kg would be 2250.  He has an additional 500 cc of fluid hanging.  Leukocytosis to 16.  Chest x-ray without obvious pneumonia.  Urinalysis pending.  Patient covered with vancomycin and Zosyn.   [CH]  0302 Patient's wife is now at the bedside.  Reports that he has complained of some abdominal pain and decreased p.o. intake over the last 2 to 3 days.  She and her son heard him fall in the bathroom.  Upon her arrival he was alert but appeared "dazed."  Does have a history of seizures but she did not note any seizure activity although she states that it took him a while to "get back to normal."  At baseline he is unable to speak but she can communicate him with limited word usage.  She has not noted any nausea or vomiting or change in bowel habits.   [CH]  1025 CT reviewed and reassuring.  No obvious intra-abdominal process.  On recheck, patient indicates that he feels much better.  He has a persistently elevated lactate at 4.1 but his blood pressures have been stable  over the last several hours.  No obvious infectious source.  Question whether he may have had a seizure prior to arrival causing the fall and resulting in elevated lactate.  He has also had some decreased oral intake has likely resulted in some mild AKI and dehydration.  Again this could contribute as well.  Wife and patient indicated that they would like to go home if at all possible.  He has received over 30 cc/kg fluid.  I discussed that we will repeat lactate.  If this is downtrending and reassuring, feel it is reasonable to send him home as there is no obvious infectious source and patient has potential other reasons for elevated lactate and current presentation.   [CH]    Clinical Course User Index [CH] Ilisha Blust, Barbette Hair, MD   MDM Rules/Calculators/A&P                          Patient initially presented with altered mental status per EMS.  Reported unwitnessed fall at home.  He is difficult to elicit a history from as he is aphasic.  Initially afebrile.  Blood pressure per EMS was low in the 60s; however, repeat blood pressure here 120s.  Work-up initiated by provider who provided medical screening exam to include basic labs, urinalysis, lactate, chest x-ray.  Patient was given fluids.  See clinical course above.  Patient is notably afebrile.  Have low suspicion of sepsis; however, lactate returned greater than 4 at 4.9.  Given this and a leukocytosis on CBC, sepsis work-up was pursued and patient was given afull 30 cc/kg and broad-spectrum antibiotics.  No infectious source was obvious.  Chest x-ray without pneumothorax or pneumonia.  Urinalysis without obvious UTI.  Covid and flu testing are negative.  See clinical course above regarding concerns from wife about abdominal pain.  He has some diffuse tenderness on exam but no focal tenderness.  For this reason, CT scan was obtained to rule out intra-abdominal infectious process.  His  lab work does show some evidence of dehydration with acute  elevation in his creatinine.  CT scan reviewed and is without any significant intra-abdominal finding.  CT head does not show any acute traumatic injury or new bleed.  Patient had 2 reported low blood pressures but otherwise responded well to fluids and had persistently normal and reassuring blood pressures.  Given all clinical information, have lower suspicious for sepsis at this time as patient has remained afebrile and there is no obvious infectious source after extensive imaging and lab work.  Question whether dehydration may have contributed to his marginal blood pressures and lactic acidosis.  Also question whether he may have had a seizure prior to arrival that resulted in his fall.  He does not appear to have any acute traumatic injury and is otherwise at his baseline.  Lactate not clear as predicted although he had not received his full 30 cc/kg prior to his second lactate.  He states he is feeling much better.  We will repeat a third lactate.  If downtrending and reassuring, patient would like to be discharged home.  Feel this is reasonable as he has been extensively worked up.  I answered all their questions at the bedside.  Final Clinical Impression(s) / ED Diagnoses Final diagnoses:  Altered mental status, unspecified altered mental status type  Dehydration  AKI (acute kidney injury) ([place])  Elevated lactic acid level    Rx / DC Orders ED Discharge Orders    None       Abbrielle Batts, Barbette Hair, MD 04/25/20 3570    Merryl Hacker, MD 04/25/20 [phone]

## 2020-04-25 ENCOUNTER — Observation Stay (HOSPITAL_COMMUNITY)
Admit: 2020-04-25 | Discharge: 2020-04-25 | Disposition: A | Discharge status: Home / Self Care | Payer: Medicare HMO | Attending: Internal Medicine | Admitting: Internal Medicine

## 2020-04-25 ENCOUNTER — Emergency Department (HOSPITAL_COMMUNITY): Payer: Medicare HMO

## 2020-04-25 DIAGNOSIS — R4182 Altered mental status, unspecified: Secondary | ICD-10-CM

## 2020-04-25 DIAGNOSIS — R7989 Other specified abnormal findings of blood chemistry: Secondary | ICD-10-CM

## 2020-04-25 DIAGNOSIS — E860 Dehydration: Secondary | ICD-10-CM | POA: Diagnosis not present

## 2020-04-25 DIAGNOSIS — K219 Gastro-esophageal reflux disease without esophagitis: Secondary | ICD-10-CM | POA: Diagnosis not present

## 2020-04-25 DIAGNOSIS — Z8673 Personal history of transient ischemic attack (TIA), and cerebral infarction without residual deficits: Secondary | ICD-10-CM

## 2020-04-25 DIAGNOSIS — N179 Acute kidney failure, unspecified: Secondary | ICD-10-CM | POA: Diagnosis not present

## 2020-04-25 DIAGNOSIS — I700 Atherosclerosis of aorta: Secondary | ICD-10-CM | POA: Diagnosis not present

## 2020-04-25 DIAGNOSIS — N261 Atrophy of kidney (terminal): Secondary | ICD-10-CM | POA: Diagnosis not present

## 2020-04-25 DIAGNOSIS — K8020 Calculus of gallbladder without cholecystitis without obstruction: Secondary | ICD-10-CM | POA: Diagnosis not present

## 2020-04-25 DIAGNOSIS — R569 Unspecified convulsions: Secondary | ICD-10-CM | POA: Diagnosis not present

## 2020-04-25 DIAGNOSIS — M4319 Spondylolisthesis, multiple sites in spine: Secondary | ICD-10-CM | POA: Diagnosis not present

## 2020-04-25 LAB — URINALYSIS, ROUTINE W REFLEX MICROSCOPIC
Bacteria, UA: NONE SEEN
Bilirubin Urine: NEGATIVE
Glucose, UA: NEGATIVE mg/dL
Hgb urine dipstick: NEGATIVE
Ketones, ur: NEGATIVE mg/dL
Leukocytes,Ua: NEGATIVE
Nitrite: NEGATIVE
Protein, ur: 100 mg/dL — AB
Specific Gravity, Urine: 1.016 (ref 1.005–1.030)
pH: 5 (ref 5.0–8.0)

## 2020-04-25 LAB — LACTIC ACID, PLASMA
Lactic Acid, Venous: 4.9 mmol/L (ref 0.5–1.9)
Lactic Acid, Venous: 4.1 mmol/L (ref 0.5–1.9)
Lactic Acid, Venous: 3.3 mmol/L (ref 0.5–1.9)
Lactic Acid, Venous: 2.6 mmol/L (ref 0.5–1.9)

## 2020-04-25 LAB — CBC WITH DIFFERENTIAL/PLATELET
Abs Immature Granulocytes: 0.16 10*3/uL — ABNORMAL HIGH (ref 0.00–0.07)
Basophils Absolute: 0 10*3/uL (ref 0.0–0.1)
Basophils Relative: 0 %
Eosinophils Absolute: 0 10*3/uL (ref 0.0–0.5)
Eosinophils Relative: 0 %
HCT: 44.3 % (ref 39.0–52.0)
Hemoglobin: 13.6 g/dL (ref 13.0–17.0)
Immature Granulocytes: 1 %
Lymphocytes Relative: 10 %
Lymphs Abs: 1.6 10*3/uL (ref 0.7–4.0)
MCH: 28.8 pg (ref 26.0–34.0)
MCHC: 30.7 g/dL (ref 30.0–36.0)
MCV: 93.9 fL (ref 80.0–100.0)
Monocytes Absolute: 1.1 10*3/uL — ABNORMAL HIGH (ref 0.1–1.0)
Monocytes Relative: 7 %
Neutro Abs: 13.5 10*3/uL — ABNORMAL HIGH (ref 1.7–7.7)
Neutrophils Relative %: 82 %
Platelets: 181 10*3/uL (ref 150–400)
RBC: 4.72 MIL/uL (ref 4.22–5.81)
RDW: 14.6 % (ref 11.5–15.5)
WBC: 16.5 10*3/uL — ABNORMAL HIGH (ref 4.0–10.5)
nRBC: 0 % (ref 0.0–0.2)

## 2020-04-25 LAB — COMPREHENSIVE METABOLIC PANEL
ALT: 16 U/L (ref 0–44)
AST: 28 U/L (ref 15–41)
Albumin: 2.7 g/dL — ABNORMAL LOW (ref 3.5–5.0)
Alkaline Phosphatase: 240 U/L — ABNORMAL HIGH (ref 38–126)
Anion gap: 12 (ref 5–15)
BUN: 26 mg/dL — ABNORMAL HIGH (ref 8–23)
CO2: 17 mmol/L — ABNORMAL LOW (ref 22–32)
Calcium: 8 mg/dL — ABNORMAL LOW (ref 8.9–10.3)
Chloride: 105 mmol/L (ref 98–111)
Creatinine, Ser: 1.51 mg/dL — ABNORMAL HIGH (ref 0.61–1.24)
GFR, Estimated: 50 mL/min — ABNORMAL LOW (ref >60)
Glucose, Bld: 157 mg/dL — ABNORMAL HIGH (ref 70–99)
Potassium: 4 mmol/L (ref 3.5–5.1)
Sodium: 134 mmol/L — ABNORMAL LOW (ref 135–145)
Total Bilirubin: 0.8 mg/dL (ref 0.3–1.2)
Total Protein: 6.5 g/dL (ref 6.5–8.1)

## 2020-04-25 LAB — TSH: TSH: 3.218 u[IU]/mL (ref 0.350–4.500)

## 2020-04-25 LAB — MAGNESIUM: Magnesium: 2.3 mg/dL (ref 1.7–2.4)

## 2020-04-25 LAB — RESPIRATORY PANEL BY RT PCR (FLU A&B, COVID)
Influenza A by PCR: NEGATIVE
Influenza B by PCR: NEGATIVE
SARS Coronavirus 2 by RT PCR: NEGATIVE

## 2020-04-25 LAB — HIV ANTIBODY (ROUTINE TESTING W REFLEX): HIV Screen 4th Generation wRfx: NONREACTIVE

## 2020-04-25 LAB — VITAMIN D 25 HYDROXY (VIT D DEFICIENCY, FRACTURES): Vit D, 25-Hydroxy: 11.86 ng/mL — ABNORMAL LOW (ref 30–100)

## 2020-04-25 LAB — VITAMIN B12: Vitamin B-12: 276 pg/mL (ref 180–914)

## 2020-04-25 LAB — PHOSPHORUS: Phosphorus: 3.5 mg/dL (ref 2.5–4.6)

## 2020-04-25 MED ORDER — FLUTICASONE-UMECLIDIN-VILANT 100-62.5-25 MCG/INH IN AEPB: 1.0000 | INHALATION_SPRAY | Freq: Every day | RESPIRATORY_TRACT | Status: DC

## 2020-04-25 MED ORDER — INFLUENZA VAC A&B SA ADJ QUAD 0.5 ML IM PRSY
0.5000 mL | PREFILLED_SYRINGE | INTRAMUSCULAR | Status: AC
  Administered 2020-04-26: 0.5 mL via INTRAMUSCULAR
  Filled 2020-04-25 (×2): qty 0.5

## 2020-04-25 MED ORDER — ALUM & MAG HYDROXIDE-SIMETH 200-200-20 MG/5ML PO SUSP
30.0000 mL | Freq: Once | ORAL | Status: AC
  Administered 2020-04-25: 30 mL via ORAL
  Filled 2020-04-25: qty 30

## 2020-04-25 MED ORDER — LEVETIRACETAM 500 MG PO TABS
500.0000 mg | ORAL_TABLET | Freq: Two times a day (BID) | ORAL | Status: DC
  Administered 2020-04-25 – 2020-04-26 (×3): 500 mg via ORAL
  Filled 2020-04-25 (×4): qty 1

## 2020-04-25 MED ORDER — SODIUM CHLORIDE 0.9 % IV SOLN: INTRAVENOUS | Status: DC

## 2020-04-25 MED ORDER — PIPERACILLIN-TAZOBACTAM 3.375 G IVPB 30 MIN
3.3750 g | Freq: Once | INTRAVENOUS | Status: AC
  Administered 2020-04-25: 3.375 g via INTRAVENOUS
  Filled 2020-04-25: qty 50

## 2020-04-25 MED ORDER — ONDANSETRON HCL 4 MG PO TABS: 4.0000 mg | ORAL_TABLET | Freq: Four times a day (QID) | ORAL | Status: DC | PRN

## 2020-04-25 MED ORDER — UMECLIDINIUM BROMIDE 62.5 MCG/INH IN AEPB
1.0000 | INHALATION_SPRAY | Freq: Every day | RESPIRATORY_TRACT | Status: DC
  Administered 2020-04-26: 1 via RESPIRATORY_TRACT
  Filled 2020-04-25: qty 7

## 2020-04-25 MED ORDER — LACTATED RINGERS IV SOLN: INTRAVENOUS | Status: DC

## 2020-04-25 MED ORDER — ONDANSETRON HCL 4 MG/2ML IJ SOLN: 4.0000 mg | Freq: Four times a day (QID) | INTRAMUSCULAR | Status: DC | PRN

## 2020-04-25 MED ORDER — VANCOMYCIN HCL IN DEXTROSE 1-5 GM/200ML-% IV SOLN
1000.0000 mg | Freq: Once | INTRAVENOUS | Status: AC
  Administered 2020-04-25: 1000 mg via INTRAVENOUS
  Filled 2020-04-25: qty 200

## 2020-04-25 MED ORDER — VENLAFAXINE HCL ER 75 MG PO CP24
150.0000 mg | ORAL_CAPSULE | Freq: Every day | ORAL | Status: DC
  Administered 2020-04-25 – 2020-04-26 (×2): 150 mg via ORAL
  Filled 2020-04-25: qty 2
  Filled 2020-04-25 (×2): qty 4

## 2020-04-25 MED ORDER — HEPARIN SODIUM (PORCINE) 5000 UNIT/ML IJ SOLN
5000.0000 [IU] | Freq: Three times a day (TID) | INTRAMUSCULAR | Status: DC
  Administered 2020-04-25 – 2020-04-26 (×4): 5000 [IU] via SUBCUTANEOUS
  Filled 2020-04-25 (×4): qty 1

## 2020-04-25 MED ORDER — PANTOPRAZOLE SODIUM 40 MG PO TBEC
40.0000 mg | DELAYED_RELEASE_TABLET | Freq: Every day | ORAL | Status: DC
  Administered 2020-04-25 – 2020-04-26 (×2): 40 mg via ORAL
  Filled 2020-04-25 (×2): qty 1

## 2020-04-25 MED ORDER — FINASTERIDE 5 MG PO TABS
2.5000 mg | ORAL_TABLET | Freq: Every day | ORAL | Status: DC
  Administered 2020-04-25: 2.5 mg via ORAL
  Filled 2020-04-25 (×2): qty 0.5
  Filled 2020-04-25: qty 1
  Filled 2020-04-25: qty 0.5

## 2020-04-25 MED ORDER — SODIUM CHLORIDE 0.9 % IV BOLUS
500.0000 mL | Freq: Once | INTRAVENOUS | Status: AC
  Administered 2020-04-25: 500 mL via INTRAVENOUS

## 2020-04-25 MED ORDER — ROSUVASTATIN CALCIUM 10 MG PO TABS
10.0000 mg | ORAL_TABLET | Freq: Every day | ORAL | Status: DC
  Administered 2020-04-25: 10 mg via ORAL
  Filled 2020-04-25 (×2): qty 1

## 2020-04-25 MED ORDER — CLOPIDOGREL BISULFATE 75 MG PO TABS
75.0000 mg | ORAL_TABLET | Freq: Every day | ORAL | Status: DC
  Administered 2020-04-25 – 2020-04-26 (×2): 75 mg via ORAL
  Filled 2020-04-25 (×2): qty 1

## 2020-04-25 MED ORDER — PNEUMOCOCCAL VAC POLYVALENT 25 MCG/0.5ML IJ INJ
0.5000 mL | INJECTION | INTRAMUSCULAR | Status: AC
  Administered 2020-04-26: 0.5 mL via INTRAMUSCULAR
  Filled 2020-04-25: qty 0.5

## 2020-04-25 MED ORDER — ACETAMINOPHEN 325 MG PO TABS
650.0000 mg | ORAL_TABLET | Freq: Four times a day (QID) | ORAL | Status: DC | PRN
  Administered 2020-04-25: 650 mg via ORAL
  Filled 2020-04-25: qty 2

## 2020-04-25 MED ORDER — ACETAMINOPHEN 650 MG RE SUPP: 650.0000 mg | Freq: Four times a day (QID) | RECTAL | Status: DC | PRN

## 2020-04-25 MED ORDER — FLUTICASONE FUROATE-VILANTEROL 100-25 MCG/INH IN AEPB
1.0000 | INHALATION_SPRAY | Freq: Every day | RESPIRATORY_TRACT | Status: DC
  Administered 2020-04-26: 07:00:00 1 via RESPIRATORY_TRACT
  Filled 2020-04-25: qty 28

## 2020-04-25 NOTE — Evaluation (Signed)
Physical Therapy Evaluation Patient Details Name: [name] [name] MRN: [id number] DOB: 1951/01/23 Today's Date: 04/25/2020   History of Present Illness  This is 69 year old male with a history of seizures, stroke, hyperlipidemia who presents with reported unresponsiveness.  Per EMS, was found on the floor by his family unresponsive.  Blood sugar was 213 in route.  He was given fluid bolus.  Upon arrival there was concern for low blood pressure.  He was screened by my partner and noted to have normal blood pressure normal temperature.  Unfortunately, patient is unable to really contribute to history taking.  He has significant aphasia secondary to his prior stroke.  He is alert on my evaluation.  When asked if he is in pain he nodded his head no.  When asked if he felt short of breath he nodded yes.  Unclear whether he had any recent illnesses.    Clinical Impression  Patient demonstrates labored movement for sitting up at bedside mostly due to non-functional RUE, c/o dizziness upon sitting up and worsened when standing with sitting BP 90/60, standing BP dropped to 79/58 - RN notified.  Patient unsteady on feet with frequent leaning on nearby objects for support, but when dizziness decreased, patient able to ambulate with less assistance.  Patient put back to bed after therapy.  Patient will benefit from continued physical therapy in hospital and recommended venue below to increase strength, balance, endurance for safe ADLs and gait.    Follow Up Recommendations Home health PT;Supervision for mobility/OOB;Supervision - Intermittent    Equipment Recommendations  None recommended by PT    Recommendations for Other Services       Precautions / Restrictions Precautions Precautions: Fall Restrictions Weight Bearing Restrictions: No      Mobility  Bed Mobility Overal bed mobility: Needs Assistance Bed Mobility: Supine to Sit;Sit to Supine     Supine to sit: Min assist Sit to supine:  Supervision   General bed mobility comments: had diffuclty sitting up in gurney secondary to no rail to pull self up with (non-functional RU)    Transfers Overall transfer level: Needs assistance Equipment used: 1 person hand held assist Transfers: Sit to/from [facility] Sit to Stand: Min guard Stand pivot transfers: Min guard       General transfer comment: unsteady on feet when initally standing mostly due to c/o dizziness  Ambulation/Gait Ambulation/Gait assistance: Min guard Gait Distance (Feet): 15 Feet Assistive device: 1 person hand held assist Gait Pattern/deviations: Decreased step length - right;Decreased stance time - right;Decreased stride length Gait velocity: decreased   General Gait Details: unsteady labored movement initially during ambulation having to lean on nearby objects for support, after dizziness decreased, patient able to ambulate with less assistance  Stairs            Wheelchair Mobility    Modified Rankin (Stroke Patients Only)       Balance Overall balance assessment: Needs assistance Sitting-balance support: Feet supported;No upper extremity supported Sitting balance-Leahy Scale: Fair Sitting balance - Comments: fair/good seated at EOB   Standing balance support: During functional activity;No upper extremity supported Standing balance-Leahy Scale: Poor Standing balance comment: fair/poor initially mostly due to dizziness, after dizziness decreased, standing balance improved                             Pertinent Vitals/Pain Pain Assessment: No/denies pain    Home Living Family/patient expects to be discharged to:: Private residence Living  Arrangements: Spouse/significant other Available Help at Discharge: Family;Available PRN/intermittently Type of Home: Mobile home Home Access: Ramped entrance     Home Layout: One level Home Equipment: None      Prior Function Level of Independence: Needs  assistance   Gait / Transfers Assistance Needed: Household and short distanced community ambulator without AD  ADL's / Homemaking Assistance Needed: assisted by family        Hand Dominance   Dominant Hand: Left    Extremity/Trunk Assessment   Upper Extremity Assessment Upper Extremity Assessment: RUE deficits/detail;LUE deficits/detail RUE Deficits / Details: grossly 1/5, mostly non-functional RUE: Unable to fully assess due to immobilization RUE Sensation: decreased proprioception RUE Coordination: decreased fine motor;decreased gross motor LUE Deficits / Details: grossly 4/5 LUE Sensation: WNL LUE Coordination: WNL    Lower Extremity Assessment Lower Extremity Assessment: Generalized weakness;RLE deficits/detail RLE Deficits / Details: grossly 3+/5 RLE Sensation: decreased proprioception;decreased light touch RLE Coordination: decreased fine motor;decreased gross motor    Cervical / Trunk Assessment Cervical / Trunk Assessment: Normal  Communication   Communication: Expressive difficulties;Other (comment) (had diffiuclty understanding patient, but patient able to answer yes/no questions consistently)  Cognition Arousal/Alertness: Awake/alert Behavior During Therapy: WFL for tasks assessed/performed Overall Cognitive Status: Within Functional Limits for tasks assessed                                        General Comments      Exercises     Assessment/Plan    PT Assessment Patient needs continued PT services  PT Problem List Decreased strength;Decreased activity tolerance;Decreased balance;Decreased mobility       PT Treatment Interventions Balance training;Gait training;Stair training;Functional mobility training;Therapeutic activities;Therapeutic exercise;Patient/family education    PT Goals (Current goals can be found in the Care Plan section)  Acute Rehab PT Goals Patient Stated Goal: return home with family to assist PT Goal  Formulation: With patient Time For Goal Achievement: 05/02/20 Potential to Achieve Goals: Good    Frequency Min 3X/week   Barriers to discharge        Co-evaluation               AM-PAC PT "6 Clicks" Mobility  Outcome Measure Help needed turning from your back to your side while in a flat bed without using bedrails?: A Little Help needed moving from lying on your back to sitting on the side of a flat bed without using bedrails?: A Little Help needed moving to and from a bed to a chair (including a wheelchair)?: A Little Help needed standing up from a chair using your arms (e.g., wheelchair or bedside chair)?: A Little Help needed to walk in hospital room?: A Little Help needed climbing 3-5 steps with a railing? : A Lot 6 Click Score: 17    End of Session Equipment Utilized During Treatment: Oxygen Activity Tolerance: Patient tolerated treatment well;Patient limited by fatigue Patient left: in bed;with call bell/phone within reach Nurse Communication: Mobility status PT Visit Diagnosis: Unsteadiness on feet (R26.81);Other abnormalities of gait and mobility (R26.89);Muscle weakness (generalized) (M62.81)    Time: 4081-4481 PT Time Calculation (min) (ACUTE ONLY): 24 min   Charges:   PT Evaluation $PT Eval Moderate Complexity: 1 Mod PT Treatments $Therapeutic Activity: 23-37 mins        3:01 PM, 04/25/20 Lonell Grandchild, MPT Physical Therapist with [facility] [phone] office [phone] mobile phone

## 2020-04-25 NOTE — Progress Notes (Signed)
EEG complete - results pending 

## 2020-04-25 NOTE — Procedures (Signed)
Patient Name: LA DIBELLA  MRN: [id number]  Epilepsy Attending: Lora Havens  Referring Physician/Provider: Dr Barton Dubois Date: 04/25/2020 Duration: 27.18 mins  Patient history: 69 year old male with history of left MCA infarct, seizures who presented with altered mental status.  EEG to evaluate for seizures.  Level of alertness: Awake  AEDs during EEG study: Keppra  Technical aspects: This EEG study was done with scalp electrodes positioned according to the 10-20 International system of electrode placement. Electrical activity was acquired at a sampling rate of 500Hz  and reviewed with a high frequency filter of 70Hz  and a low frequency filter of 1Hz . EEG data were recorded continuously and digitally stored.   Description: The posterior dominant rhythm consists of 8 Hz activity of moderate voltage (25-35 uV) seen predominantly in posterior head regions, symmetric and reactive to eye opening and eye closing. EEG showed continuous 3 to 6 Hz theta-delta slowing in left hemisphere. Physiologic photic driving was not seen during photic stimulation.  Hyperventilation was not performed.     ABNORMALITY -Continuous slow, left hemisphere  IMPRESSION: This study is suggestive of cortical dysfunction in left hemisphere consistent with underlying encephalomalacia.  No seizures or epileptiform discharges were seen throughout the recording.  Chandani Rogowski Barbra Sarks

## 2020-04-25 NOTE — ED Notes (Signed)
Date and time results received: 04/25/20 0134 (use smartphrase ".now" to insert current time)  Test: Lactic Acid Critical Value: 4.9  Name of Provider Notified: Horton, MD  Orders Received? Or Actions Taken?:

## 2020-04-25 NOTE — Progress Notes (Signed)
CODE SESIS being tracked by [facility]

## 2020-04-25 NOTE — Plan of Care (Signed)
  Problem: Acute Rehab PT Goals(only PT should resolve) Goal: Pt Will Go Supine/Side To Sit Outcome: Progressing Flowsheets (Taken 04/25/2020 1502) Pt will go Supine/Side to Sit: with modified independence Goal: Patient Will Transfer Sit To/From Stand Outcome: Progressing Flowsheets (Taken 04/25/2020 1502) Patient will transfer sit to/from stand: with supervision Goal: Pt Will Transfer Bed To Chair/Chair To Bed Outcome: Progressing Flowsheets (Taken 04/25/2020 1502) Pt will Transfer Bed to Chair/Chair to Bed: with supervision Goal: Pt Will Ambulate Outcome: Progressing Flowsheets (Taken 04/25/2020 1502) Pt will Ambulate:  > 125 feet  with supervision  with least restrictive assistive device   3:03 PM, 04/25/20 Lonell Grandchild, MPT Physical Therapist with [facility] [phone] office [phone] mobile phone

## 2020-04-25 NOTE — ED Provider Notes (Signed)
Patient CARE signed out to continue to monitor.  Patient presented with hypotension, less responsive and found on the floor. Patient had significant hypotension and elevated lactate greater than 4.  Patient had sepsis evaluation initiated and received 30 cc/kg and IV antibiotics. Patient is gradually improved with fluids no witnessed seizure activity before arrival or in the ER.  Differential diagnosis includes seizure, dehydration, kidney injury, sepsis, other. No obvious source of infection on exam or blood work or testing at this time.  Patient's lactate remained elevated 3.3 on repeat.  Discussed with hospitalist and patient that we recommend further observation to follow blood cultures and monitor lactates and clinical exam with vitals.  Patient did not have an IV, ultrasound-guided by myself.  Ultrasound ED Peripheral IV (Provider)  Date/Time: 04/25/2020 8:28 AM Performed by: Elnora Morrison, MD Authorized by: Elnora Morrison, MD   Procedure details:    Indications: multiple failed IV attempts     Location:  Left AC   Angiocath:  20 G   Bedside Ultrasound Guided: Yes     Images: archived     Patient tolerated procedure without complications: Yes     The patients results and plan were reviewed and discussed.   Any x-rays performed were independently reviewed by myself.   Differential diagnosis were considered with the presenting HPI.  Medications  lactated ringers infusion ( Intravenous New Bag/Given 04/25/20 0810)  sodium chloride 0.9 % bolus 1,000 mL (0 mLs Intravenous Stopped 04/25/20 0126)  sodium chloride 0.9 % bolus 500 mL (0 mLs Intravenous Stopped 04/25/20 0204)  vancomycin (VANCOCIN) IVPB 1000 mg/200 mL premix (0 mg Intravenous Stopped 04/25/20 0349)  piperacillin-tazobactam (ZOSYN) IVPB 3.375 g (0 g Intravenous Stopped 04/25/20 0251)    Vitals:   04/25/20 0515 04/25/20 0730 04/25/20 0745 04/25/20 0800  BP: 110/79 108/70 105/70 103/71  Pulse: 71 73 75 74  Resp: 15 18 19  20   Temp:  97.6 F (36.4 C)    TempSrc:  Oral    SpO2: 100% 100% 100% 100%  Weight:      Height:        Final diagnoses:  Altered mental status, unspecified altered mental status type  Dehydration  AKI (acute kidney injury) ([place])  Elevated lactic acid level    Admission/ observation were discussed with the admitting physician, patient and/or family..   Hypotension Lactic acidosis   Elnora Morrison, MD 04/25/20 [phone]

## 2020-04-25 NOTE — H&P (Signed)
History and Physical    [name] [name] TGG:[id number] DOB: 1951-03-01 DOA: 04/24/2020  PCP: [place] Pink, MD   Patient coming from: home  I have personally briefly reviewed patient's old medical records in [place]  Chief Complaint: AMS, dehydration, lactic acidosis  HPI: [name] [name] is a 69 y.o. male with medical history significant of seizures, prior stroke, hyperlipidemia, COPD, hypertension and depression; who presented to the emergency department after episode of unresponsiveness and found on the floor by wife.  Patient unable to contribute much to history expressing no recall of events.  Patient expressed having nausea and vomiting. He denies chest pain, fever, abdominal pain, dysuria, hematuria, coughing spells or any other complaints.  Covid test negative  ED Course: On presentation patient was hypotensive with subsequent response to fluid resuscitation; extensive work-up ruling out acute infection sources.  Lactic acid was elevated concerning for syncope and seizure as culprit for his symptoms.  Patient was found with acute kidney injury and also with a still elevated lactic acid.  TRH has been consulted to place patient in the hospital for further evaluation and management.  Review of Systems: As per HPI otherwise all other systems reviewed and are negative.   Past Medical History:  Diagnosis Date  . Depression   . High cholesterol   . Seizures ([place])   . Stroke [facility])     Past Surgical History:  Procedure Laterality Date  . ELBOW SURGERY    . lower back    . NECK SURGERY      Social History  reports that he has been smoking. He has a 46.00 pack-year smoking history. He does not have any smokeless tobacco history on file. He reports current alcohol use. He reports that he does not use drugs.  Allergies  Allergen Reactions  . Celebrex [Celecoxib] Nausea And Vomiting  . Codeine    Family history: -positive for HTN, otherwise non-contributory    Prior to Admission medications   Medication Sig Start Date End Date Taking? Authorizing Provider  clopidogrel (PLAVIX) 75 MG tablet Take 75 mg by mouth daily.  07/16/13  Yes [provider]  dantrolene (DANTRIUM) 25 MG capsule Take 25 mg by mouth daily.   Yes [provider]  finasteride (PROSCAR) 5 MG tablet Take 5 mg by mouth daily. 03/06/20  Yes [provider]  levETIRAcetam (KEPPRA) 500 MG tablet Take 500 mg by mouth 2 (two) times daily. 04/12/20  Yes [provider]  rosuvastatin (CRESTOR) 10 MG tablet Take 10 mg by mouth at bedtime. 03/06/20  Yes [provider]  TRELEGY ELLIPTA 100-62.5-25 MCG/INH AEPB Inhale 1 puff into the lungs daily. 12/06/19  Yes [provider]  venlafaxine XR (EFFEXOR-XR) 150 MG 24 hr capsule Take 1 capsule by mouth daily. 04/12/20  Yes [provider]    Physical Exam: Vitals:   04/25/20 0515 04/25/20 0730 04/25/20 0745 04/25/20 0800  BP: 110/79 108/70 105/70 103/71  Pulse: 71 73 75 74  Resp: 15 18 19 20   Temp:  97.6 F (36.4 C)    TempSrc:  Oral    SpO2: 100% 100% 100% 100%  Weight:      Height:        Constitutional: Calm and in no distress; currently reporting no chest pain, shortness of breath or abdominal pain. Vitals:   04/25/20 0515 04/25/20 0730 04/25/20 0745 04/25/20 0800  BP: 110/79 108/70 105/70 103/71  Pulse: 71 73 75 74  Resp: 15 18 19  20  Temp:  97.6 F (36.4 C)    TempSrc:  Oral    SpO2: 100% 100% 100% 100%  Weight:      Height:       Eyes: PERRL, lids and conjunctivae normal, no icterus, no nystagmus. ENMT: Mucous membranes are dry on physical exam. Posterior pharynx clear of any exudate or lesions. Neck: normal, supple, no masses, no thyromegaly,  Respiratory: clear to no JVD.auscultation bilaterally, no wheezing, no crackles. Normal respiratory effort.  Cardiovascular: Regular rate and rhythm, no rubs, no gallops.  No lower extremity edema. Abdomen: no  tenderness, no masses palpated. No hepatosplenomegaly. Bowel sounds positive.  Musculoskeletal: no clubbing / cyanosis. No joint deformity upper and lower extremities. Good ROM, no contractures. Normal muscle tone.  Skin: no rashes, no petechiae. Neurologic: CN 2-12 grossly intact.  No new focal deficits. Psychiatric: Stable mood.  Following simple commands.  Slow to respond.   Labs on Admission: I have personally reviewed following labs and imaging studies  CBC: Recent Labs  Lab 04/24/20 2336  WBC 16.5*  NEUTROABS 13.5*  HGB 13.6  HCT 44.3  MCV 93.9  PLT 496    Basic Metabolic Panel: Recent Labs  Lab 04/24/20 2336  NA 134*  K 4.0  CL 105  CO2 17*  GLUCOSE 157*  BUN 26*  CREATININE 1.51*  CALCIUM 8.0*    GFR: Estimated Creatinine Clearance: 43.6 mL/min (A) (by C-G formula based on SCr of 1.51 mg/dL (H)).  Liver Function Tests: Recent Labs  Lab 04/24/20 2336  AST 28  ALT 16  ALKPHOS 240*  BILITOT 0.8  PROT 6.5  ALBUMIN 2.7*    Urine analysis:    Component Value Date/Time   COLORURINE YELLOW 04/25/2020 0123   APPEARANCEUR CLEAR 04/25/2020 0123   LABSPEC 1.016 04/25/2020 0123   PHURINE 5.0 04/25/2020 0123   GLUCOSEU NEGATIVE 04/25/2020 0123   HGBUR NEGATIVE 04/25/2020 0123   BILIRUBINUR NEGATIVE 04/25/2020 0123   KETONESUR NEGATIVE 04/25/2020 0123   PROTEINUR 100 (A) 04/25/2020 0123   UROBILINOGEN 0.2 11/05/2009 1112   NITRITE NEGATIVE 04/25/2020 0123   LEUKOCYTESUR NEGATIVE 04/25/2020 0123    Radiological Exams on Admission: CT ABDOMEN PELVIS WO CONTRAST  Result Date: 04/25/2020 CLINICAL DATA:  Acute nonlocalized abdominal pain EXAM: CT ABDOMEN AND PELVIS WITHOUT CONTRAST TECHNIQUE: Multidetector CT imaging of the abdomen and pelvis was performed following the standard protocol without IV contrast. COMPARISON:  None similar FINDINGS: Lower chest:  Emphysema, panlobular.  Lower lobe scarring. Hepatobiliary: No focal liver abnormality.Faint calcified  gallstones. No evidence of cholecystitis. Pancreas: Unremarkable. Spleen: Unremarkable. Adrenals/Urinary Tract: Negative adrenals. No hydronephrosis or stone. 4 low-density right renal lesions with small size limiting densitometry, only measuring up to 12 mm in size. None of the show detectable solid density. Even smaller left renal cortical lesions are present, with not densitometry possible. Symmetric renal atrophy. Unremarkable bladder given collapse. Stomach/Bowel:  No obstruction. No visible bowel inflammation. Vascular/Lymphatic: No acute vascular abnormality. Scattered atheromatous calcifications of the aorta and iliacs. Coronary calcification. No mass or adenopathy. Reproductive:No pathologic findings. Other: No ascites or pneumoperitoneum. Musculoskeletal: No acute abnormalities. L5 chronic bilateral pars defects. L5-S1 anterolisthesis. Sequela of avulsion injury to the right ischial tuberosity. IMPRESSION: 1. No acute intra-abdominal finding. 2. Cholelithiasis. 3.  Emphysema (ICD10-J43.9). Electronically Signed   By: Monte Fantasia M.D.   On: 04/25/2020 05:56   CT Head Wo Contrast  Result Date: 04/24/2020 CLINICAL DATA:  Mental status change. Found on bathroom floor unresponsive. EXAM: CT HEAD WITHOUT CONTRAST  TECHNIQUE: Contiguous axial images were obtained from the base of the skull through the vertex without intravenous contrast. COMPARISON:  Remote head CT 11/05/2009 FINDINGS: Brain: No acute intracranial hemorrhage. Remote left MCA distribution infarct with encephalomalacia throughout the anterior temple, frontal, anterior parietal lobes. Similar findings are seen on prior exam. There is ex vacuo dilatation of the left lateral ventricle. No hydrocephalus. No evidence of acute ischemia. No subdural or extra-axial collection. Basilar cisterns are patent. Vascular: Atherosclerosis of skullbase vasculature without hyperdense vessel or abnormal calcification. Skull: No fracture. Stable sclerotic  density in the left aspect of the clivus, stability for 10 years indicates benign process. No suspicious bone lesion. Sinuses/Orbits: Paranasal sinuses and mastoid air cells are clear. The visualized orbits are unremarkable. Other: None. IMPRESSION: 1. No acute intracranial abnormality. 2. Remote left MCA distribution infarct. Electronically Signed   By: Keith Rake M.D.   On: 04/24/2020 23:53   DG Chest Port 1 View  Result Date: 04/24/2020 CLINICAL DATA:  [place], unresponsive EXAM: PORTABLE CHEST 1 VIEW COMPARISON:  11/05/2009 FINDINGS: Single frontal view of the chest demonstrates an unremarkable cardiac silhouette. No airspace disease, effusion, or pneumothorax. No acute displaced fracture. IMPRESSION: 1. No acute intrathoracic process. Electronically Signed   By: Randa Ngo M.D.   On: 04/24/2020 23:14    EKG: Independently reviewed.  No acute ischemic changes.  Normal sinus rhythm and normal rate.  Assessment/Plan 1-altered mental status -With concern for postictal event -Patient with underlying history of seizure disorder -Being followed will also check B12, TSH and vitamin D -Provide fluid resuscitation and check orthostatic vital signs.  2-lactic acidosis -Continue aggressive fluid resuscitation and follow lactic acid level -No acute source of infection appreciated -Follow antibiotics currently.  3-hypertension: Presenting with hypotension -Will hold antihypertensive agents currently -Monitor renal telemetry -Provide fluid resuscitation and follow vital signs.  4-kidney injury -Appears to be prerenal azotemia and decreased perfusion from hypovolemia -Provide fluid restriction-follow renal function trend -minimize the use of nephrotoxic agents -If condition failed to improved will check renal ultrasound.  5-depression -continue effexor -no SI or hallucinations  6-n/v -with concerns for GERD -will start PPI -PRN Gi cocktail ordered  7-BPH -no complaints of  urinary retention currently -continue proscar QHS  8-prior hx of stroke -continue plavix and risk factors modifications  9-HLD -Continue statin  10-history of seizure disorder -Will check EEG -Continue Keppra.  DVT prophylaxis: heparin Code Status:   Full code Family Communication:  Wife at bedside. Disposition Plan:   Patient is from:  home  Anticipated DC to:  home  Anticipated DC date:  05/08/2020  Anticipated DC barriers: Stabilization of renal function and correction of lactic acid. Consults called:  None  Admission status:  Telemetry, LOS < 2 midnights, observation.  Severity of Illness: Mild to moderate illness; patient with episode of AMS/syncope and concerns for seizure; currently with AKI and ongoing lactic acidosis. Will continue IVF's and follow clinical response. Orthostatic evaluation in am and EEG.    Barton Dubois MD [facility]  How to contact the [facility] Attending or Consulting provider [place] or covering provider during after hours [place], for this patient?   1. Check the care team in [facility] and look for a) attending/consulting TRH provider listed and b) the [facility] team listed 2. Log into [email or web address] and use [place] universal password to access. If you do not have the password, please contact the hospital operator. 3. Locate the [facility] provider you are looking for under [facility]  and page to a number that you can be directly reached. 4. If you still have difficulty reaching the provider, please page the [facility] (Director on Call) for the Hospitalists listed on amion for assistance.  04/25/2020, 9:26 AM

## 2020-04-25 NOTE — Care Management Obs Status (Signed)
[place] NOTIFICATION   Patient Details  Name: [name] [name] MRN: [id number] Date of Birth: 1950/07/04   Medicare Observation Status Notification Given:  Yes    Tommy Medal 04/25/2020, 4:05 PM

## 2020-04-26 DIAGNOSIS — N179 Acute kidney failure, unspecified: Secondary | ICD-10-CM | POA: Diagnosis not present

## 2020-04-26 DIAGNOSIS — G459 Transient cerebral ischemic attack, unspecified: Secondary | ICD-10-CM | POA: Diagnosis not present

## 2020-04-26 DIAGNOSIS — I499 Cardiac arrhythmia, unspecified: Secondary | ICD-10-CM | POA: Diagnosis not present

## 2020-04-26 DIAGNOSIS — R404 Transient alteration of awareness: Secondary | ICD-10-CM | POA: Diagnosis not present

## 2020-04-26 DIAGNOSIS — E860 Dehydration: Secondary | ICD-10-CM | POA: Diagnosis not present

## 2020-04-26 LAB — BASIC METABOLIC PANEL
Anion gap: 9 (ref 5–15)
BUN: 27 mg/dL — ABNORMAL HIGH (ref 8–23)
CO2: 19 mmol/L — ABNORMAL LOW (ref 22–32)
Calcium: 7.8 mg/dL — ABNORMAL LOW (ref 8.9–10.3)
Chloride: 103 mmol/L (ref 98–111)
Creatinine, Ser: 1.21 mg/dL (ref 0.61–1.24)
GFR, Estimated: >60 mL/min (ref >60)
Glucose, Bld: 112 mg/dL — ABNORMAL HIGH (ref 70–99)
Potassium: 4.5 mmol/L (ref 3.5–5.1)
Sodium: 131 mmol/L — ABNORMAL LOW (ref 135–145)

## 2020-04-26 LAB — CBC
HCT: 36.1 % — ABNORMAL LOW (ref 39.0–52.0)
Hemoglobin: 11.4 g/dL — ABNORMAL LOW (ref 13.0–17.0)
MCH: 28.2 pg (ref 26.0–34.0)
MCHC: 31.6 g/dL (ref 30.0–36.0)
MCV: 89.4 fL (ref 80.0–100.0)
Platelets: 214 10*3/uL (ref 150–400)
RBC: 4.04 MIL/uL — ABNORMAL LOW (ref 4.22–5.81)
RDW: 14.6 % (ref 11.5–15.5)
WBC: 13.3 10*3/uL — ABNORMAL HIGH (ref 4.0–10.5)
nRBC: 0 % (ref 0.0–0.2)

## 2020-04-26 LAB — LACTIC ACID, PLASMA: Lactic Acid, Venous: 1.6 mmol/L (ref 0.5–1.9)

## 2020-04-26 LAB — PROLACTIN: Prolactin: 8.5 ng/mL (ref 4.0–15.2)

## 2020-04-26 MED ORDER — PANTOPRAZOLE SODIUM 40 MG PO TBEC: 40.0000 mg | DELAYED_RELEASE_TABLET | Freq: Every day | ORAL | 0 refills | Status: AC

## 2020-04-26 NOTE — TOC Transition Note (Signed)
Transition of Care [facility]) - CM/SW Discharge Note  Patient Details  Name: [name] [name] MRN: [id number] Date of Birth: 11/23/1950  Transition of Care [facility]) CM/SW Contact:  Sherie Don, LCSW Phone Number: 05/01/2020, 11:50 AM  Clinical Narrative: Patient is a 69 year old male who is under observation for AKI. PT evaluation recommended HHPT. CSW spoke with patient and patient's wife, [name] [name], regarding recommendations. Patient and wife declined [place] services at this time. Hospitalist updated. TOC signing off.  Final next level of care: Home/Self Care Barriers to Discharge: Barriers Resolved  Patient Goals and CMS Choice Patient states their goals for this hospitalization and ongoing recovery are:: Discharge home CMS Medicare.gov Compare Post Acute Care list provided to:: Patient Choice offered to / list presented to : Patient  Discharge Plan and Services     DME Arranged: N/A DME Agency: NA HH Arranged: NA [place] Agency: NA  Readmission Risk Interventions No flowsheet data found.

## 2020-04-26 NOTE — Progress Notes (Signed)
Physical Therapy Treatment Patient Details Name: [name] [name] MRN: [id number] DOB: May 08, 1951 Today's Date: 05/15/2020    History of Present Illness This is 69 year old male with a history of seizures, stroke, hyperlipidemia who presents with reported unresponsiveness.  Per EMS, was found on the floor by his family unresponsive.  Blood sugar was 213 in route.  He was given fluid bolus.  Upon arrival there was concern for low blood pressure.  He was screened by my partner and noted to have normal blood pressure normal temperature.  Unfortunately, patient is unable to really contribute to history taking.  He has significant aphasia secondary to his prior stroke.  He is alert on my evaluation.  When asked if he is in pain he nodded his head no.  When asked if he felt short of breath he nodded yes.  Unclear whether he had any recent illnesses.    PT Comments    Patient cooperative for therapy, but limited mostly due to c/o dizziness with BP dropping when sitting/standing when sitting BP 103/80, standing BP dropped to 92/60 - RN notified.  Patient tolerated standing up at bedside with frequent leaning on nearby objects for support, urinated in bedside urinal, but declined to attempt ambulation away from bedside due to c/o fatigue and mild dizziness.  Patient put back to bed after therapy.  Patient will benefit from continued physical therapy in hospital and recommended venue below to increase strength, balance, endurance for safe ADLs and gait.   Follow Up Recommendations  Home health PT;Supervision for mobility/OOB;Supervision - Intermittent     Equipment Recommendations  None recommended by PT    Recommendations for Other Services       Precautions / Restrictions Precautions Precautions: Fall Restrictions Weight Bearing Restrictions: No    Mobility  Bed Mobility Overal bed mobility: Needs Assistance Bed Mobility: Supine to Sit;Sit to Supine     Supine to sit: Min assist Sit to  supine: Modified independent (Device/Increase time);Supervision   General bed mobility comments: patient attempted to rock self to sitting position, but unable due generalized weakness  Transfers Overall transfer level: Needs assistance Equipment used: 1 person hand held assist Transfers: Sit to/from Stand;Stand Pivot Transfers Sit to Stand: Min guard Stand pivot transfers: Min guard       General transfer comment: increased time, labored movement  Ambulation/Gait   Gait Distance (Feet): 2 Feet Assistive device: 1 person hand held assist Gait Pattern/deviations: Decreased step length - right;Decreased step length - left;Decreased stride length Gait velocity: decreased   General Gait Details: limited to 2-3 unsteady steps at bedside having to lean on nearby objects for support mostly due to c/o dizziness   Stairs             Wheelchair Mobility    Modified Rankin (Stroke Patients Only)       Balance Overall balance assessment: Needs assistance Sitting-balance support: Feet supported;No upper extremity supported Sitting balance-Leahy Scale: Fair Sitting balance - Comments: fair/good static seated at EOB, fair/poor when putting on socks   Standing balance support: During functional activity;No upper extremity supported Standing balance-Leahy Scale: Fair Standing balance comment: fair/good when supporting self with LUE                            Cognition Arousal/Alertness: Awake/alert Behavior During Therapy: WFL for tasks assessed/performed Overall Cognitive Status: Within Functional Limits for tasks assessed  Exercises      General Comments        Pertinent Vitals/Pain Pain Assessment: No/denies pain    Home Living                      Prior Function            PT Goals (current goals can now be found in the care plan section) Acute Rehab PT Goals Patient Stated Goal:  return home with family to assist PT Goal Formulation: With patient Time For Goal Achievement: 05/02/20 Potential to Achieve Goals: Good Progress towards PT goals: Progressing toward goals    Frequency    Min 3X/week      PT Plan Current plan remains appropriate    Co-evaluation              AM-PAC PT "6 Clicks" Mobility   Outcome Measure  Help needed turning from your back to your side while in a flat bed without using bedrails?: A Little Help needed moving from lying on your back to sitting on the side of a flat bed without using bedrails?: A Little Help needed moving to and from a bed to a chair (including a wheelchair)?: A Little Help needed standing up from a chair using your arms (e.g., wheelchair or bedside chair)?: A Little Help needed to walk in hospital room?: A Little Help needed climbing 3-5 steps with a railing? : A Lot 6 Click Score: 17    End of Session   Activity Tolerance: Patient tolerated treatment well;Patient limited by fatigue Patient left: in bed;with call bell/phone within reach Nurse Communication: Mobility status PT Visit Diagnosis: Unsteadiness on feet (R26.81);Other abnormalities of gait and mobility (R26.89);Muscle weakness (generalized) (M62.81)     Time: 3007-6226 PT Time Calculation (min) (ACUTE ONLY): 22 min  Charges:  $Therapeutic Activity: 8-22 mins                     10:00 AM, 05/05/2020 Lonell Grandchild, MPT Physical Therapist with [facility] [phone] office [phone] mobile phone

## 2020-04-27 NOTE — Discharge Summary (Signed)
Physician Discharge Summary  VIRGINIA FRANCISCO NLZ:[id number] DOB: 11-17-1950 DOA: 04/24/2020  PCP: [place] Pink, MD  Admit date: 04/24/2020 Discharge date: 05/02/2020  Admitted From: Home.  Disposition:   Home.   Recommendations for Outpatient Follow-up:  1. Follow up with PCP in 1-2 weeks 2. Please obtain BMP/CBC in one week  Discharge Condition: STABLE.  CODE STATUS: FULL CODE.  Diet recommendation: Heart Healthy   Brief/Interim Summary:  MIZAEL SAGAR is a 69 y.o. male with medical history significant of seizures, prior stroke, hyperlipidemia, COPD, hypertension and depression; who presented to the emergency department after episode of unresponsiveness .  He was found to be in AKI and elevated lactic acid.  TRH consulted and he was admitted. He was given IV fluids and his creatinine has improved.  Meanwhile his  menatl status improved with IV fluids. EEG was done, it was negative for active seizures.  PT recommended home health PT, which the patient and wife refused.   Discharge Diagnoses:  Active Problems:   AKI (acute kidney injury) ([place])   Acute metabolic encephalopathy sec to post ictal  and AKI.  -Patient with underlying history of seizure disorder Recommended VITAMIN D supplementation on discharge, wife reported OTC supplementation.    Lactic acidosis No source of infection found, probably secondary to dehydration Repeat levels have normalized.     Hypotension Provided fluid resuscitation and repeat blood pressure parameters have been optimal. Orthostatic blood pressure measurements have been normal  Mild AKI -Appears to be prerenal azotemia and decreased perfusion from hypovolemia Resolved with fluid resuscitation.  Depression -continue effexor -no SI or hallucinations  Nausea and vomiting Resolved.  Probably secondary to GERD. 7-BPH -no complaints of urinary retention currently -continue proscar QHS  8-prior hx of stroke -continue plavix  and risk factors modifications  Hyperlipidemia -Continue statin  Seizure disorder EEG is negative for active epileptiform activity. Continue Keppra.  Discharge Instructions  Discharge Instructions    Diet - low sodium heart healthy   Complete by: As directed    Increase activity slowly   Complete by: As directed      Allergies as of 05/13/2020      Reactions   Celebrex [celecoxib] Nausea And Vomiting   Codeine       Medication List    TAKE these medications   clopidogrel 75 MG tablet Commonly known as: PLAVIX Take 75 mg by mouth daily.   dantrolene 25 MG capsule Commonly known as: DANTRIUM Take 25 mg by mouth daily.   finasteride 5 MG tablet Commonly known as: PROSCAR Take 5 mg by mouth daily.   levETIRAcetam 500 MG tablet Commonly known as: KEPPRA Take 500 mg by mouth 2 (two) times daily.   pantoprazole 40 MG tablet Commonly known as: PROTONIX Take 1 tablet (40 mg total) by mouth daily.   rosuvastatin 10 MG tablet Commonly known as: CRESTOR Take 10 mg by mouth at bedtime.   Trelegy Ellipta 100-62.5-25 MCG/INH Aepb Generic drug: Fluticasone-Umeclidin-Vilant Inhale 1 puff into the lungs daily.   venlafaxine XR 150 MG 24 hr capsule Commonly known as: EFFEXOR-XR Take 1 capsule by mouth daily.       Allergies  Allergen Reactions  . Celebrex [Celecoxib] Nausea And Vomiting  . Codeine     Consultations: none  Procedures/Studies: CT ABDOMEN PELVIS WO CONTRAST  Result Date: 04/25/2020 CLINICAL DATA:  Acute nonlocalized abdominal pain EXAM: CT ABDOMEN AND PELVIS WITHOUT CONTRAST TECHNIQUE: Multidetector CT imaging of the abdomen and pelvis was performed following the standard  protocol without IV contrast. COMPARISON:  None similar FINDINGS: Lower chest:  Emphysema, panlobular.  Lower lobe scarring. Hepatobiliary: No focal liver abnormality.Faint calcified gallstones. No evidence of cholecystitis. Pancreas: Unremarkable. Spleen: Unremarkable.  Adrenals/Urinary Tract: Negative adrenals. No hydronephrosis or stone. 4 low-density right renal lesions with small size limiting densitometry, only measuring up to 12 mm in size. None of the show detectable solid density. Even smaller left renal cortical lesions are present, with not densitometry possible. Symmetric renal atrophy. Unremarkable bladder given collapse. Stomach/Bowel:  No obstruction. No visible bowel inflammation. Vascular/Lymphatic: No acute vascular abnormality. Scattered atheromatous calcifications of the aorta and iliacs. Coronary calcification. No mass or adenopathy. Reproductive:No pathologic findings. Other: No ascites or pneumoperitoneum. Musculoskeletal: No acute abnormalities. L5 chronic bilateral pars defects. L5-S1 anterolisthesis. Sequela of avulsion injury to the right ischial tuberosity. IMPRESSION: 1. No acute intra-abdominal finding. 2. Cholelithiasis. 3.  Emphysema (ICD10-J43.9). Electronically Signed   By: Monte Fantasia M.D.   On: 04/25/2020 05:56   CT Head Wo Contrast  Result Date: 04/24/2020 CLINICAL DATA:  Mental status change. Found on bathroom floor unresponsive. EXAM: CT HEAD WITHOUT CONTRAST TECHNIQUE: Contiguous axial images were obtained from the base of the skull through the vertex without intravenous contrast. COMPARISON:  Remote head CT 11/05/2009 FINDINGS: Brain: No acute intracranial hemorrhage. Remote left MCA distribution infarct with encephalomalacia throughout the anterior temple, frontal, anterior parietal lobes. Similar findings are seen on prior exam. There is ex vacuo dilatation of the left lateral ventricle. No hydrocephalus. No evidence of acute ischemia. No subdural or extra-axial collection. Basilar cisterns are patent. Vascular: Atherosclerosis of skullbase vasculature without hyperdense vessel or abnormal calcification. Skull: No fracture. Stable sclerotic density in the left aspect of the clivus, stability for 10 years indicates benign process. No  suspicious bone lesion. Sinuses/Orbits: Paranasal sinuses and mastoid air cells are clear. The visualized orbits are unremarkable. Other: None. IMPRESSION: 1. No acute intracranial abnormality. 2. Remote left MCA distribution infarct. Electronically Signed   By: Keith Rake M.D.   On: 04/24/2020 23:53   DG Chest Port 1 View  Result Date: 04/24/2020 CLINICAL DATA:  [place], unresponsive EXAM: PORTABLE CHEST 1 VIEW COMPARISON:  11/05/2009 FINDINGS: Single frontal view of the chest demonstrates an unremarkable cardiac silhouette. No airspace disease, effusion, or pneumothorax. No acute displaced fracture. IMPRESSION: 1. No acute intrathoracic process. Electronically Signed   By: Randa Ngo M.D.   On: 04/24/2020 23:14   EEG adult  Result Date: 04/25/2020 Lora Havens, MD     04/25/2020  1:33 PM Patient Name: WYLEE DORANTES MRN: [id number] Epilepsy Attending: Lora Havens Referring Physician/Provider: Dr Barton Dubois Date: 04/25/2020 Duration: 27.18 mins Patient history: 69 year old male with history of left MCA infarct, seizures who presented with altered mental status.  EEG to evaluate for seizures. Level of alertness: Awake AEDs during EEG study: Keppra Technical aspects: This EEG study was done with scalp electrodes positioned according to the 10-20 International system of electrode placement. Electrical activity was acquired at a sampling rate of 500Hz  and reviewed with a high frequency filter of 70Hz  and a low frequency filter of 1Hz . EEG data were recorded continuously and digitally stored. Description: The posterior dominant rhythm consists of 8 Hz activity of moderate voltage (25-35 uV) seen predominantly in posterior head regions, symmetric and reactive to eye opening and eye closing. EEG showed continuous 3 to 6 Hz theta-delta slowing in left hemisphere. Physiologic photic driving was not seen during photic stimulation.  Hyperventilation was not performed.  ABNORMALITY -Continuous slow,  left hemisphere IMPRESSION: This study is suggestive of cortical dysfunction in left hemisphere consistent with underlying encephalomalacia.  No seizures or epileptiform discharges were seen throughout the recording. Priyanka Barbra Sarks       Subjective: No new complaints  Discharge Exam: Vitals:   05/07/2020 0900 05/04/2020 0905  BP: 103/80 92/60  Pulse: 79   Resp: 18   Temp: 98.1 F (36.7 C)   SpO2: 92%    Vitals:   05/04/2020 0416 04/30/2020 0716 05/03/2020 0900 04/24/2020 0905  BP: 95/64  103/80 92/60  Pulse: 82  79   Resp: 19  18   Temp: 99 F (37.2 C)  98.1 F (36.7 C)   TempSrc: Oral  Oral   SpO2: 91% 92% 92%   Weight:      Height:        General: Pt is alert, awake, not in acute distress Cardiovascular: RRR, S1/S2 +, no rubs, no gallops Respiratory: CTA bilaterally, no wheezing, no rhonchi Abdominal: Soft, NT, ND, bowel sounds + Extremities: no edema, no cyanosis    The results of significant diagnostics from this hospitalization (including imaging, microbiology, ancillary and laboratory) are listed below for reference.     Microbiology: Recent Results (from the past 240 hour(s))  Respiratory Panel by RT PCR (Flu A&B, Covid) - Nasopharyngeal Swab     Status: None   Collection Time: 04/25/20  2:04 AM   Specimen: Nasopharyngeal Swab  Result Value Ref Range Status   SARS Coronavirus 2 by RT PCR NEGATIVE NEGATIVE Final    Comment: (NOTE) SARS-CoV-2 target nucleic acids are NOT DETECTED.  The SARS-CoV-2 RNA is generally detectable in upper respiratoy specimens during the acute phase of infection. The lowest concentration of SARS-CoV-2 viral copies this assay can detect is 131 copies/mL. A negative result does not preclude SARS-Cov-2 infection and should not be used as the sole basis for treatment or other patient management decisions. A negative result may occur with  improper specimen collection/handling, submission of specimen other than nasopharyngeal swab,  presence of viral mutation(s) within the areas targeted by this assay, and inadequate number of viral copies (<131 copies/mL). A negative result must be combined with clinical observations, patient history, and epidemiological information. The expected result is Negative.  Fact Sheet for Patients:  PinkCheek.be  Fact Sheet for Healthcare Providers:  GravelBags.it  This test is no t yet approved or cleared by the [place] FDA and  has been authorized for detection and/or diagnosis of SARS-CoV-2 by FDA under an Emergency Use Authorization (EUA). This EUA will remain  in effect (meaning this test can be used) for the duration of the COVID-19 declaration under Section 564(b)(1) of the Act, 21 U.S.C. section 360bbb-3(b)(1), unless the authorization is terminated or revoked sooner.     Influenza A by PCR NEGATIVE NEGATIVE Final   Influenza B by PCR NEGATIVE NEGATIVE Final    Comment: (NOTE) The Xpert Xpress SARS-CoV-2/FLU/RSV assay is intended as an aid in  the diagnosis of influenza from Nasopharyngeal swab specimens and  should not be used as a sole basis for treatment. Nasal washings and  aspirates are unacceptable for Xpert Xpress SARS-CoV-2/FLU/RSV  testing.  Fact Sheet for Patients: PinkCheek.be  Fact Sheet for Healthcare Providers: GravelBags.it  This test is not yet approved or cleared by the [place] FDA and  has been authorized for detection and/or diagnosis of SARS-CoV-2 by  FDA under an Emergency Use Authorization (EUA). This EUA will remain  in effect (  meaning this test can be used) for the duration of the  Covid-19 declaration under Section 564(b)(1) of the Act, 21  U.S.C. section 360bbb-3(b)(1), unless the authorization is  terminated or revoked. Performed at [facility], [address] [place], [place] [address]   Blood culture (routine x  2)     Status: None (Preliminary result)   Collection Time: 04/25/20  2:16 AM   Specimen: BLOOD LEFT HAND  Result Value Ref Range Status   Specimen Description BLOOD LEFT HAND  Final   Special Requests   Final    BOTTLES DRAWN AEROBIC AND ANAEROBIC Blood Culture adequate volume   Culture   Final    NO GROWTH 1 DAY Performed at [facility], [address], [place], [place] [address]    Report Status PENDING  Incomplete  Blood culture (routine x 2)     Status: None (Preliminary result)   Collection Time: 04/25/20  2:17 AM   Specimen: BLOOD  Result Value Ref Range Status   Specimen Description BLOOD BLOOD LEFT WRIST  Final   Special Requests AEROBIC BOTTLE ONLY Blood Culture adequate volume  Final   Culture   Final    NO GROWTH 1 DAY Performed at [facility], [address], [place],  [address]    Report Status PENDING  Incomplete     Labs: BNP (last 3 results) No results for input(s): BNP in the last 8760 hours. Basic Metabolic Panel: Recent Labs  Lab 04/24/20 2336 04/25/20 0653 05/08/2020 0345  NA 134*  --  131*  K 4.0  --  4.5  CL 105  --  103  CO2 17*  --  19*  GLUCOSE 157*  --  112*  BUN 26*  --  27*  CREATININE 1.51*  --  1.21  CALCIUM 8.0*  --  7.8*  MG  --  2.3  --   PHOS  --  3.5  --    Liver Function Tests: Recent Labs  Lab 04/24/20 2336  AST 28  ALT 16  ALKPHOS 240*  BILITOT 0.8  PROT 6.5  ALBUMIN 2.7*   No results for input(s): LIPASE, AMYLASE in the last 168 hours. No results for input(s): AMMONIA in the last 168 hours. CBC: Recent Labs  Lab 04/24/20 2336 04/30/2020 0345  WBC 16.5* 13.3*  NEUTROABS 13.5*  --   HGB 13.6 11.4*  HCT 44.3 36.1*  MCV 93.9 89.4  PLT 181 214   Cardiac Enzymes: No results for input(s): CKTOTAL, CKMB, CKMBINDEX, TROPONINI in the last 168 hours. BNP: Invalid input(s): POCBNP CBG: No results for input(s): GLUCAP in the last 168 hours. D-Dimer No results for input(s): DDIMER in the last 72 hours. Hgb  A1c No results for input(s): HGBA1C in the last 72 hours. Lipid Profile No results for input(s): CHOL, HDL, LDLCALC, TRIG, CHOLHDL, LDLDIRECT in the last 72 hours. Thyroid function studies Recent Labs    04/25/20 0653  TSH 3.218   Anemia work up Recent Labs    04/25/20 0653  VITAMINB12 276   Urinalysis    Component Value Date/Time   COLORURINE YELLOW 04/25/2020 0123   APPEARANCEUR CLEAR 04/25/2020 0123   LABSPEC 1.016 04/25/2020 0123   PHURINE 5.0 04/25/2020 0123   GLUCOSEU NEGATIVE 04/25/2020 0123   HGBUR NEGATIVE 04/25/2020 0123   BILIRUBINUR NEGATIVE 04/25/2020 0123   KETONESUR NEGATIVE 04/25/2020 0123   PROTEINUR 100 (A) 04/25/2020 0123   UROBILINOGEN 0.2 11/05/2009 1112   NITRITE NEGATIVE 04/25/2020 0123   LEUKOCYTESUR  NEGATIVE 04/25/2020 0123   Sepsis Labs Invalid input(s): PROCALCITONIN,  WBC,  LACTICIDVEN Microbiology Recent Results (from the past 240 hour(s))  Respiratory Panel by RT PCR (Flu A&B, Covid) - Nasopharyngeal Swab     Status: None   Collection Time: 04/25/20  2:04 AM   Specimen: Nasopharyngeal Swab  Result Value Ref Range Status   SARS Coronavirus 2 by RT PCR NEGATIVE NEGATIVE Final    Comment: (NOTE) SARS-CoV-2 target nucleic acids are NOT DETECTED.  The SARS-CoV-2 RNA is generally detectable in upper respiratoy specimens during the acute phase of infection. The lowest concentration of SARS-CoV-2 viral copies this assay can detect is 131 copies/mL. A negative result does not preclude SARS-Cov-2 infection and should not be used as the sole basis for treatment or other patient management decisions. A negative result may occur with  improper specimen collection/handling, submission of specimen other than nasopharyngeal swab, presence of viral mutation(s) within the areas targeted by this assay, and inadequate number of viral copies (<131 copies/mL). A negative result must be combined with clinical observations, patient history, and  epidemiological information. The expected result is Negative.  Fact Sheet for Patients:  PinkCheek.be  Fact Sheet for Healthcare Providers:  GravelBags.it  This test is no t yet approved or cleared by the [place] FDA and  has been authorized for detection and/or diagnosis of SARS-CoV-2 by FDA under an Emergency Use Authorization (EUA). This EUA will remain  in effect (meaning this test can be used) for the duration of the COVID-19 declaration under Section 564(b)(1) of the Act, 21 U.S.C. section 360bbb-3(b)(1), unless the authorization is terminated or revoked sooner.     Influenza A by PCR NEGATIVE NEGATIVE Final   Influenza B by PCR NEGATIVE NEGATIVE Final    Comment: (NOTE) The Xpert Xpress SARS-CoV-2/FLU/RSV assay is intended as an aid in  the diagnosis of influenza from Nasopharyngeal swab specimens and  should not be used as a sole basis for treatment. Nasal washings and  aspirates are unacceptable for Xpert Xpress SARS-CoV-2/FLU/RSV  testing.  Fact Sheet for Patients: PinkCheek.be  Fact Sheet for Healthcare Providers: GravelBags.it  This test is not yet approved or cleared by the [place] FDA and  has been authorized for detection and/or diagnosis of SARS-CoV-2 by  FDA under an Emergency Use Authorization (EUA). This EUA will remain  in effect (meaning this test can be used) for the duration of the  Covid-19 declaration under Section 564(b)(1) of the Act, 21  U.S.C. section 360bbb-3(b)(1), unless the authorization is  terminated or revoked. Performed at [facility], [address] [place], [place] [address]   Blood culture (routine x 2)     Status: None (Preliminary result)   Collection Time: 04/25/20  2:16 AM   Specimen: BLOOD LEFT HAND  Result Value Ref Range Status   Specimen Description BLOOD LEFT HAND  Final   Special Requests   Final     BOTTLES DRAWN AEROBIC AND ANAEROBIC Blood Culture adequate volume   Culture   Final    NO GROWTH 1 DAY Performed at [facility], [address], [place], [place] [address]    Report Status PENDING  Incomplete  Blood culture (routine x 2)     Status: None (Preliminary result)   Collection Time: 04/25/20  2:17 AM   Specimen: BLOOD  Result Value Ref Range Status   Specimen Description BLOOD BLOOD LEFT WRIST  Final   Special Requests AEROBIC BOTTLE ONLY Blood Culture adequate volume  Final  Culture   Final    NO GROWTH 1 DAY Performed at [facility], [address], [place], [place] [address]    Report Status PENDING  Incomplete     Time coordinating discharge:35 minutes  SIGNED:   Hosie Poisson, MD  [facility]

## 2020-04-30 LAB — CULTURE, BLOOD (ROUTINE X 2)
Culture: NO GROWTH
Culture: NO GROWTH
Special Requests: ADEQUATE
Special Requests: ADEQUATE

## 2020-05-24 DIAGNOSIS — 419620001 Death: Secondary | SNOMED CT | POA: Diagnosis not present

## 2020-05-24 DEATH — deceased

## 2020-11-03 IMAGING — CT CT HEAD W/O CM
3 series · 15 of 47 positions shown, 18 images · non-contrast
Comparison: Remote head CT 11/05/2009

CLINICAL DATA: Mental status change. Found on bathroom floor
unresponsive.

EXAM:
CT HEAD WITHOUT CONTRAST
TECHNIQUE: Contiguous axial images were obtained from the base of the skull
through the vertex without intravenous contrast.

[Series 2: head w o · axial · 0.39mm/px · z∈[+1563,+1688]mm · 9 of 31 slices shown, 12 images]
[im 3/31  brain]
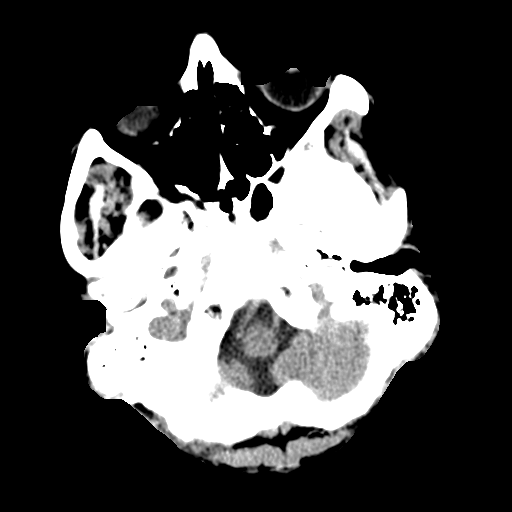
[im 3/31  bone]
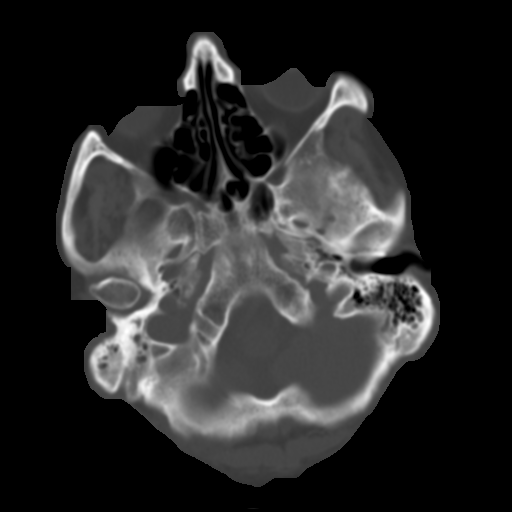
[im 6/31  brain]
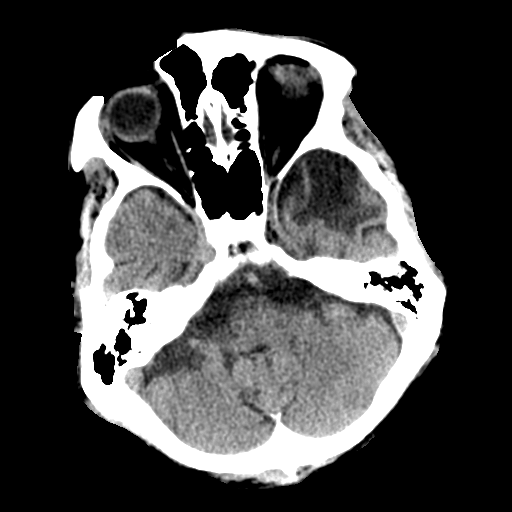
[im 9/31  brain]
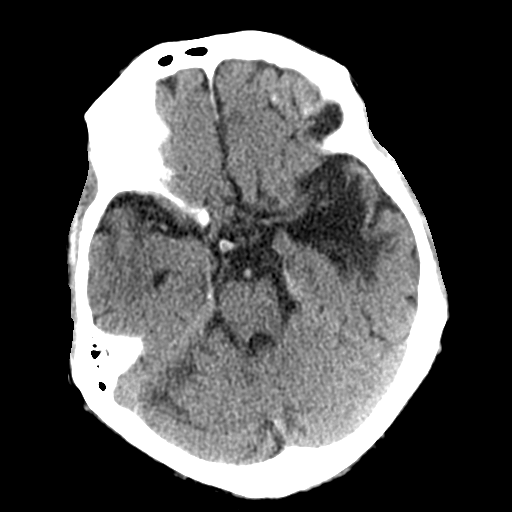
[im 12/31  brain]
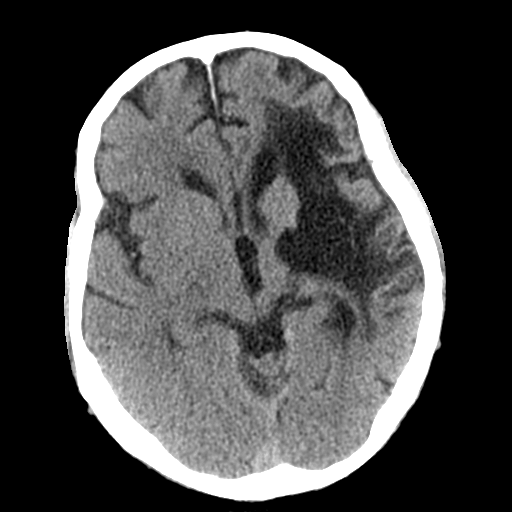
[im 16/31  brain]
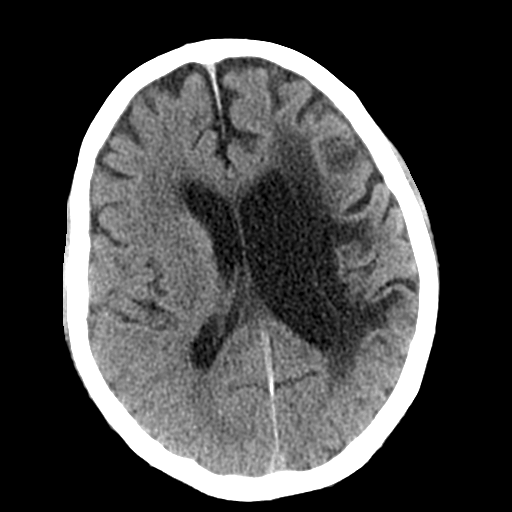
[im 16/31  bone]
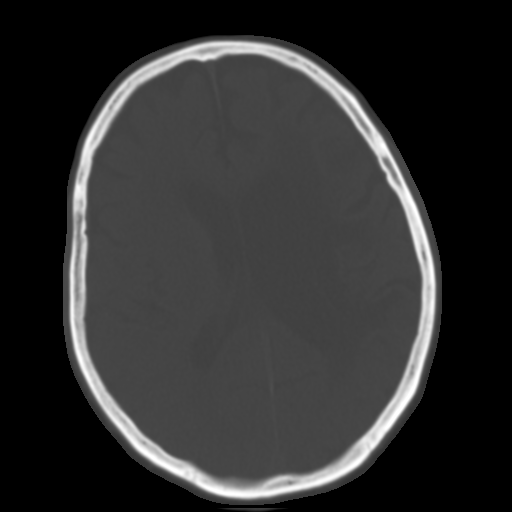
[im 19/31  brain]
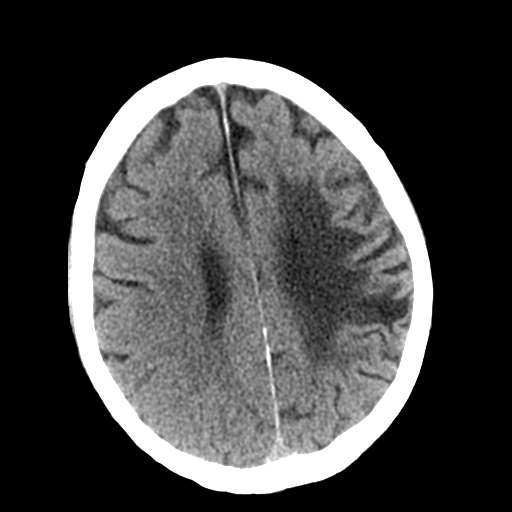
[im 22/31  brain]
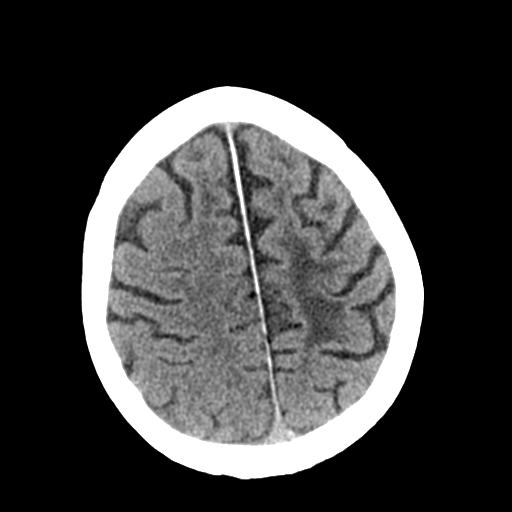
[im 25/31  brain]
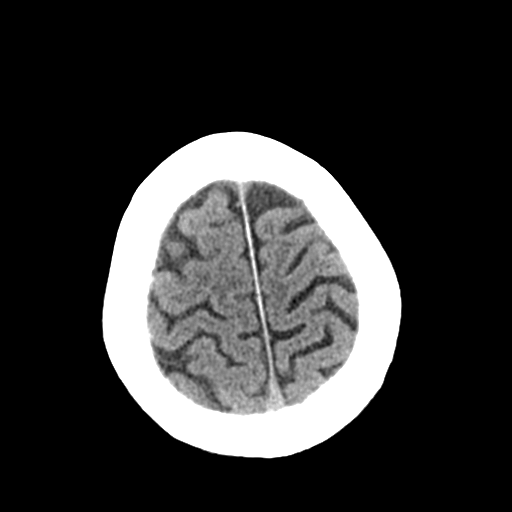
[im 28/31  brain]
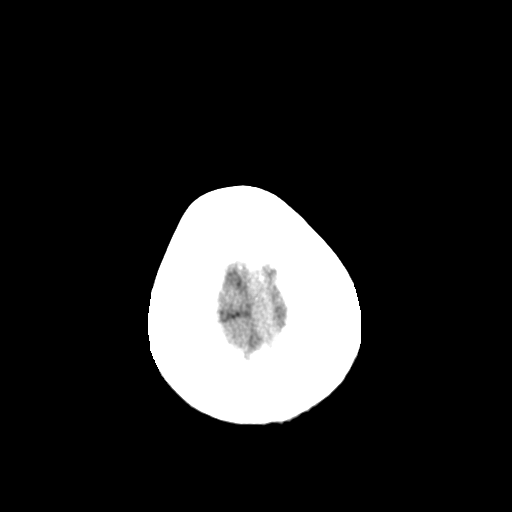
[im 28/31  bone]
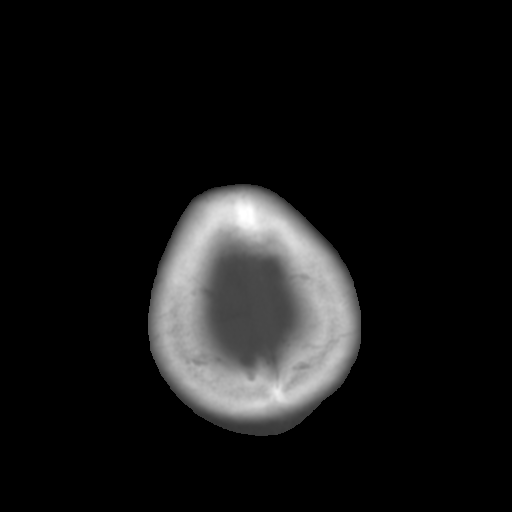

[Series 4: coronal soft · coronal · 0.31mm/px · 3 of 64 slices shown]
[im 22/64  brain]
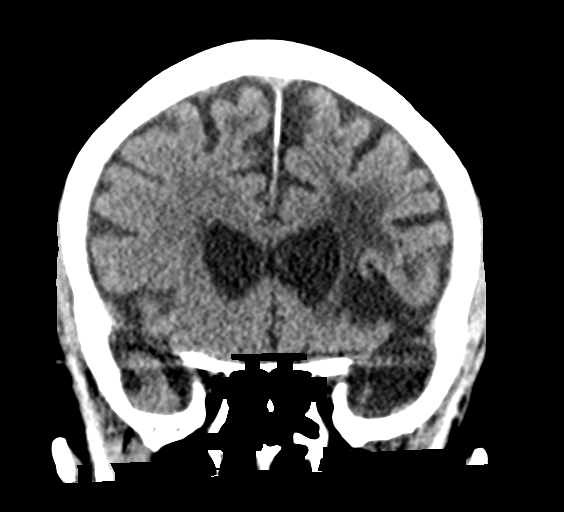
[im 29/64  brain]
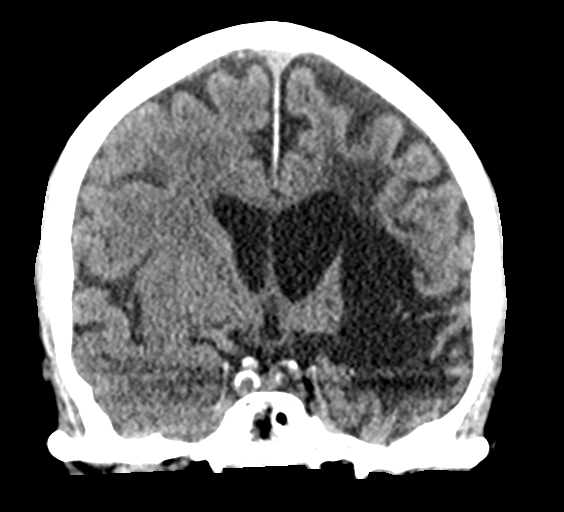
[im 36/64  brain]
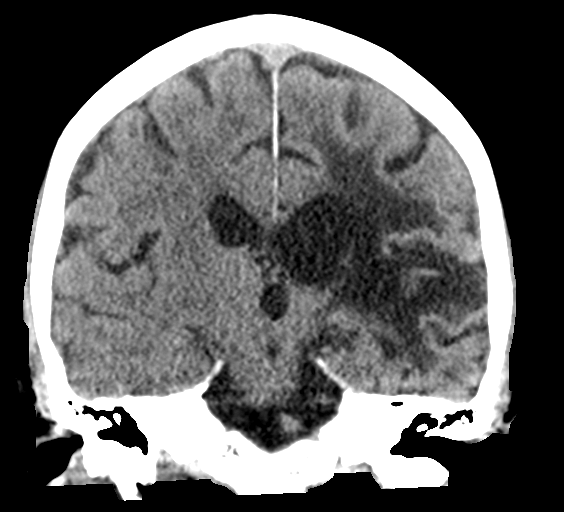

[Series 5: sagittal soft · sagittal · 0.31mm/px · 3 of 58 slices shown]
[im 20/58  brain]
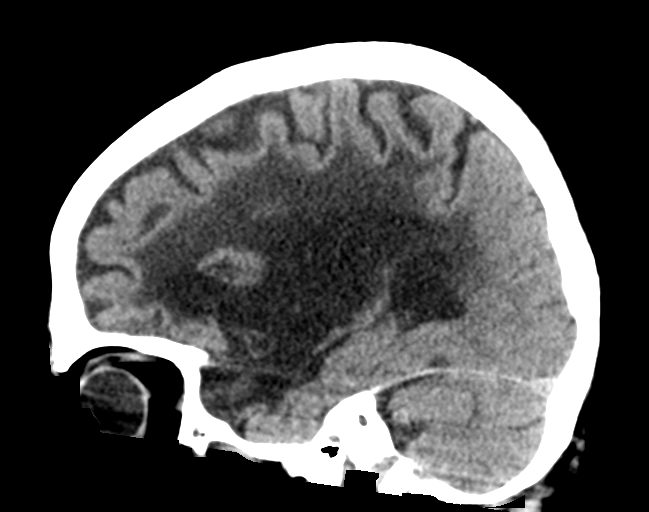
[im 29/58  brain]
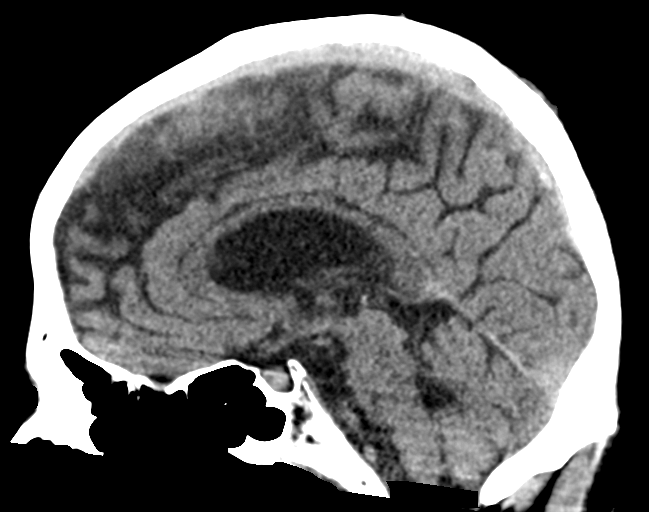
[im 39/58  brain]
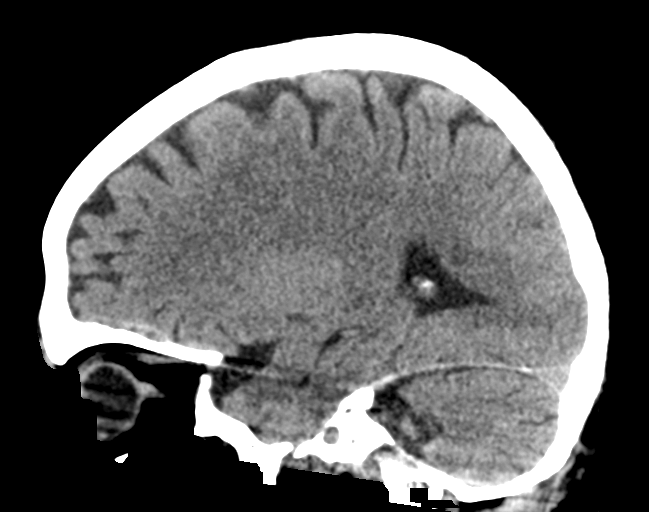

[15 of 47 positions shown; findings below may reference images not displayed]

FINDINGS: Brain: No acute intracranial hemorrhage. Remote left MCA
distribution infarct with encephalomalacia throughout the anterior
temple, frontal, anterior parietal lobes. Similar findings are seen
on prior exam. There is ex vacuo dilatation of the left lateral
ventricle. No hydrocephalus. No evidence of acute ischemia. No
subdural or extra-axial collection. Basilar cisterns are patent.

Vascular: Atherosclerosis of skullbase vasculature without
hyperdense vessel or abnormal calcification.

Skull: No fracture. Stable sclerotic density in the left aspect of
the clivus, stability for 10 years indicates benign process. No
suspicious bone lesion.

Sinuses/Orbits: Paranasal sinuses and mastoid air cells are clear.
The visualized orbits are unremarkable.

Other: None.
IMPRESSION: 1. No acute intracranial abnormality.
2. Remote left MCA distribution infarct.
